# Patient Record
Sex: Female | Born: 1960
Health system: Southern US, Community
[De-identification: ages and names within clinical notes are randomized; demographics above are authoritative.]

## PROBLEM LIST (undated history)

## (undated) DIAGNOSIS — E785 Hyperlipidemia, unspecified: Secondary | ICD-10-CM

## (undated) DIAGNOSIS — L049 Acute lymphadenitis, unspecified: Secondary | ICD-10-CM

## (undated) DIAGNOSIS — R011 Cardiac murmur, unspecified: Secondary | ICD-10-CM

## (undated) DIAGNOSIS — C801 Malignant (primary) neoplasm, unspecified: Secondary | ICD-10-CM

## (undated) HISTORY — DX: Malignant (primary) neoplasm, unspecified: C80.1

## (undated) HISTORY — DX: Cardiac murmur, unspecified: R01.1

## (undated) HISTORY — DX: Acute lymphadenitis, unspecified: L04.9

## (undated) HISTORY — DX: Hyperlipidemia, unspecified: E78.5

## (undated) HISTORY — PX: ABDOMINAL HYSTERECTOMY: SHX81

---

## 2003-11-14 ENCOUNTER — Encounter: Admission: RE | Admit: 2003-11-14 | Discharge: 2003-11-14 | Payer: Self-pay | Admitting: Family Medicine

## 2004-10-17 ENCOUNTER — Other Ambulatory Visit: Admission: RE | Admit: 2004-10-17 | Discharge: 2004-10-17 | Payer: Self-pay | Admitting: Family Medicine

## 2004-10-17 ENCOUNTER — Ambulatory Visit: Payer: Self-pay | Admitting: Family Medicine

## 2004-11-14 ENCOUNTER — Encounter: Admission: RE | Admit: 2004-11-14 | Discharge: 2004-11-14 | Payer: Self-pay | Admitting: Family Medicine

## 2005-12-18 ENCOUNTER — Ambulatory Visit: Payer: Self-pay | Admitting: Family Medicine

## 2005-12-18 ENCOUNTER — Other Ambulatory Visit: Admission: RE | Admit: 2005-12-18 | Discharge: 2005-12-18 | Payer: Self-pay | Admitting: Family Medicine

## 2005-12-18 ENCOUNTER — Encounter: Payer: Self-pay | Admitting: Family Medicine

## 2006-01-05 ENCOUNTER — Encounter: Admission: RE | Admit: 2006-01-05 | Discharge: 2006-01-05 | Payer: Self-pay | Admitting: Family Medicine

## 2007-02-01 ENCOUNTER — Telehealth (INDEPENDENT_AMBULATORY_CARE_PROVIDER_SITE_OTHER): Payer: Self-pay | Admitting: *Deleted

## 2007-02-23 ENCOUNTER — Encounter: Payer: Self-pay | Admitting: Family Medicine

## 2007-02-23 ENCOUNTER — Other Ambulatory Visit: Admission: RE | Admit: 2007-02-23 | Discharge: 2007-02-23 | Payer: Self-pay | Admitting: Family Medicine

## 2007-02-23 ENCOUNTER — Ambulatory Visit: Payer: Self-pay | Admitting: Family Medicine

## 2007-02-23 DIAGNOSIS — Z8679 Personal history of other diseases of the circulatory system: Secondary | ICD-10-CM | POA: Insufficient documentation

## 2007-02-23 DIAGNOSIS — Z9189 Other specified personal risk factors, not elsewhere classified: Secondary | ICD-10-CM | POA: Insufficient documentation

## 2007-02-26 ENCOUNTER — Encounter (INDEPENDENT_AMBULATORY_CARE_PROVIDER_SITE_OTHER): Payer: Self-pay | Admitting: *Deleted

## 2007-04-26 ENCOUNTER — Encounter: Payer: Self-pay | Admitting: Family Medicine

## 2007-04-26 ENCOUNTER — Encounter: Admission: RE | Admit: 2007-04-26 | Discharge: 2007-04-26 | Payer: Self-pay | Admitting: Family Medicine

## 2007-05-03 ENCOUNTER — Encounter (INDEPENDENT_AMBULATORY_CARE_PROVIDER_SITE_OTHER): Payer: Self-pay | Admitting: *Deleted

## 2008-03-31 ENCOUNTER — Ambulatory Visit: Payer: Self-pay | Admitting: Family Medicine

## 2008-03-31 DIAGNOSIS — D239 Other benign neoplasm of skin, unspecified: Secondary | ICD-10-CM | POA: Insufficient documentation

## 2008-03-31 DIAGNOSIS — R002 Palpitations: Secondary | ICD-10-CM | POA: Insufficient documentation

## 2008-04-06 ENCOUNTER — Ambulatory Visit: Payer: Self-pay

## 2008-04-12 ENCOUNTER — Telehealth (INDEPENDENT_AMBULATORY_CARE_PROVIDER_SITE_OTHER): Payer: Self-pay | Admitting: *Deleted

## 2008-04-12 ENCOUNTER — Encounter (INDEPENDENT_AMBULATORY_CARE_PROVIDER_SITE_OTHER): Payer: Self-pay | Admitting: *Deleted

## 2008-04-17 ENCOUNTER — Encounter: Payer: Self-pay | Admitting: Family Medicine

## 2008-04-26 ENCOUNTER — Encounter: Payer: Self-pay | Admitting: Family Medicine

## 2008-05-03 ENCOUNTER — Encounter: Admission: RE | Admit: 2008-05-03 | Discharge: 2008-05-03 | Payer: Self-pay | Admitting: Family Medicine

## 2008-05-10 ENCOUNTER — Encounter: Admission: RE | Admit: 2008-05-10 | Discharge: 2008-05-10 | Payer: Self-pay | Admitting: Family Medicine

## 2008-05-12 ENCOUNTER — Encounter (INDEPENDENT_AMBULATORY_CARE_PROVIDER_SITE_OTHER): Payer: Self-pay | Admitting: *Deleted

## 2008-05-17 ENCOUNTER — Ambulatory Visit: Payer: Self-pay | Admitting: Family Medicine

## 2008-05-17 DIAGNOSIS — L049 Acute lymphadenitis, unspecified: Secondary | ICD-10-CM | POA: Insufficient documentation

## 2008-05-17 DIAGNOSIS — L708 Other acne: Secondary | ICD-10-CM | POA: Insufficient documentation

## 2008-05-18 ENCOUNTER — Encounter: Payer: Self-pay | Admitting: Family Medicine

## 2008-07-18 ENCOUNTER — Telehealth (INDEPENDENT_AMBULATORY_CARE_PROVIDER_SITE_OTHER): Payer: Self-pay | Admitting: *Deleted

## 2008-07-24 ENCOUNTER — Ambulatory Visit: Payer: Self-pay | Admitting: Family Medicine

## 2008-07-29 LAB — CONVERTED CEMR LAB
Albumin: 4.1 g/dL (ref 3.5–5.2)
HDL: 47.8 mg/dL (ref >39.00)
Total CHOL/HDL Ratio: 3
Total Protein: 7.7 g/dL (ref 6.0–8.3)
VLDL: 12.8 mg/dL (ref 0.0–40.0)

## 2008-07-31 ENCOUNTER — Encounter (INDEPENDENT_AMBULATORY_CARE_PROVIDER_SITE_OTHER): Payer: Self-pay | Admitting: *Deleted

## 2008-08-19 HISTORY — PX: MOHS SURGERY: SUR867

## 2009-02-28 ENCOUNTER — Encounter (INDEPENDENT_AMBULATORY_CARE_PROVIDER_SITE_OTHER): Payer: Self-pay | Admitting: *Deleted

## 2009-04-24 ENCOUNTER — Ambulatory Visit: Payer: Self-pay | Admitting: Family Medicine

## 2009-04-24 ENCOUNTER — Other Ambulatory Visit: Admission: RE | Admit: 2009-04-24 | Discharge: 2009-04-24 | Payer: Self-pay | Admitting: Family Medicine

## 2009-04-24 DIAGNOSIS — Z780 Asymptomatic menopausal state: Secondary | ICD-10-CM | POA: Insufficient documentation

## 2009-04-24 DIAGNOSIS — E785 Hyperlipidemia, unspecified: Secondary | ICD-10-CM | POA: Insufficient documentation

## 2009-04-24 DIAGNOSIS — C44300 Unspecified malignant neoplasm of skin of unspecified part of face: Secondary | ICD-10-CM | POA: Insufficient documentation

## 2009-04-24 DIAGNOSIS — C44310 Basal cell carcinoma of skin of unspecified parts of face: Secondary | ICD-10-CM | POA: Insufficient documentation

## 2009-04-24 LAB — CONVERTED CEMR LAB
Ketones, urine, test strip: NEGATIVE
Nitrite: NEGATIVE
Specific Gravity, Urine: 1.025
Urobilinogen, UA: NEGATIVE
WBC Urine, dipstick: NEGATIVE
pH: 5

## 2009-04-25 ENCOUNTER — Encounter (INDEPENDENT_AMBULATORY_CARE_PROVIDER_SITE_OTHER): Payer: Self-pay | Admitting: *Deleted

## 2009-04-25 LAB — CONVERTED CEMR LAB
ALT: 32 units/L (ref 0–35)
Bilirubin, Direct: 0.1 mg/dL (ref 0.0–0.3)
Cholesterol: 162 mg/dL (ref 0–200)
Creatinine, Ser: 0.9 mg/dL (ref 0.4–1.2)
Eosinophils Absolute: 0.1 10*3/uL (ref 0.0–0.7)
Eosinophils Relative: 2 % (ref 0.0–5.0)
GFR calc non Af Amer: 70.28 mL/min (ref >60)
HDL: 59.7 mg/dL (ref >39.00)
Hemoglobin: 14.2 g/dL (ref 12.0–15.0)
LDL Cholesterol: 86 mg/dL (ref 0–99)
Lymphocytes Relative: 32.8 % (ref 12.0–46.0)
Lymphs Abs: 1.7 10*3/uL (ref 0.7–4.0)
MCHC: 33.6 g/dL (ref 30.0–36.0)
MCV: 94.9 fL (ref 78.0–100.0)
Platelets: 189 10*3/uL (ref 150.0–400.0)
Potassium: 4 meq/L (ref 3.5–5.1)
RBC: 4.45 M/uL (ref 3.87–5.11)
RDW: 12.5 % (ref 11.5–14.6)
Sodium: 140 meq/L (ref 135–145)
Total Bilirubin: 1 mg/dL (ref 0.3–1.2)
Total CHOL/HDL Ratio: 3
Total Protein: 7.7 g/dL (ref 6.0–8.3)
Triglycerides: 82 mg/dL (ref 0.0–149.0)
WBC: 5.2 10*3/uL (ref 4.5–10.5)

## 2009-04-27 ENCOUNTER — Encounter (INDEPENDENT_AMBULATORY_CARE_PROVIDER_SITE_OTHER): Payer: Self-pay | Admitting: *Deleted

## 2009-05-07 ENCOUNTER — Encounter: Payer: Self-pay | Admitting: Family Medicine

## 2009-05-07 ENCOUNTER — Encounter: Admission: RE | Admit: 2009-05-07 | Discharge: 2009-05-07 | Payer: Self-pay | Admitting: Family Medicine

## 2009-12-25 ENCOUNTER — Encounter (INDEPENDENT_AMBULATORY_CARE_PROVIDER_SITE_OTHER): Payer: Self-pay | Admitting: *Deleted

## 2010-01-15 ENCOUNTER — Ambulatory Visit: Payer: Self-pay | Admitting: Family Medicine

## 2010-01-21 LAB — CONVERTED CEMR LAB
ALT: 21 units/L (ref 0–35)
Albumin: 4.4 g/dL (ref 3.5–5.2)
Alkaline Phosphatase: 50 units/L (ref 39–117)
Bilirubin, Direct: 0.2 mg/dL (ref 0.0–0.3)
Cholesterol: 157 mg/dL (ref 0–200)
Total Bilirubin: 0.6 mg/dL (ref 0.3–1.2)

## 2010-05-08 ENCOUNTER — Encounter
Admission: RE | Admit: 2010-05-08 | Discharge: 2010-05-08 | Payer: Self-pay | Source: Home / Self Care | Attending: Family Medicine | Admitting: Family Medicine

## 2010-05-12 ENCOUNTER — Encounter: Payer: Self-pay | Admitting: Family Medicine

## 2010-05-19 LAB — CONVERTED CEMR LAB
AST: 26 units/L (ref 0–37)
Albumin: 4 g/dL (ref 3.5–5.2)
Alkaline Phosphatase: 42 units/L (ref 39–117)
BUN: 12 mg/dL (ref 6–23)
Basophils Relative: 0.1 % (ref 0.0–3.0)
Basophils Relative: 0.3 % (ref 0.0–1.0)
Bilirubin, Direct: 0.1 mg/dL (ref 0.0–0.3)
CO2: 29 meq/L (ref 19–32)
Calcium: 9.4 mg/dL (ref 8.4–10.5)
Chloride: 101 meq/L (ref 96–112)
Chloride: 103 meq/L (ref 96–112)
Creatinine, Ser: 0.9 mg/dL (ref 0.4–1.2)
Creatinine, Ser: 1 mg/dL (ref 0.4–1.2)
Direct LDL: 158.7 mg/dL
Eosinophils Absolute: 0.1 10*3/uL (ref 0.0–0.6)
Eosinophils Absolute: 0.1 10*3/uL (ref 0.0–0.7)
Eosinophils Relative: 1.8 % (ref 0.0–5.0)
Eosinophils Relative: 2 % (ref 0.0–5.0)
GFR calc Af Amer: 86 mL/min
GFR calc non Af Amer: 63 mL/min
GFR calc non Af Amer: 71 mL/min
Glucose, Bld: 90 mg/dL (ref 70–99)
HCT: 43.3 % (ref 36.0–46.0)
HDL: 58.4 mg/dL (ref >39.0)
Hemoglobin: 14.3 g/dL (ref 12.0–15.0)
Lymphocytes Relative: 34.4 % (ref 12.0–46.0)
MCHC: 35.6 g/dL (ref 30.0–36.0)
MCV: 92.7 fL (ref 78.0–100.0)
Monocytes Absolute: 0.4 10*3/uL (ref 0.2–0.7)
Monocytes Absolute: 0.6 10*3/uL (ref 0.1–1.0)
Monocytes Relative: 11.2 % (ref 3.0–12.0)
Monocytes Relative: 11.8 % — ABNORMAL HIGH (ref 3.0–11.0)
Neutro Abs: 1.7 10*3/uL (ref 1.4–7.7)
Neutro Abs: 2.9 10*3/uL (ref 1.4–7.7)
Neutrophils Relative %: 52.5 % (ref 43.0–77.0)
Platelets: 180 10*3/uL (ref 150–400)
Potassium: 4.3 meq/L (ref 3.5–5.1)
RBC: 4.45 M/uL (ref 3.87–5.11)
RDW: 11.5 % (ref 11.5–14.6)
RDW: 11.9 % (ref 11.5–14.6)
Sodium: 139 meq/L (ref 135–145)
TSH: 0.78 microintl units/mL (ref 0.35–5.50)
TSH: 1.28 microintl units/mL (ref 0.35–5.50)
Total Protein: 7.2 g/dL (ref 6.0–8.3)
Triglycerides: 45 mg/dL (ref 0–149)
Triglycerides: 75 mg/dL (ref 0–149)

## 2010-05-22 NOTE — Letter (Signed)
Summary: Results Follow up Letter  [facility] [place]  [address] [place], [place] [address]   Phone: [phone]  Fax: [phone]    04/25/2009 MRN: [id number]  Tanya Nelson [address] [place], [place]  [address]  Dear Ms. Kinkade,  The following are the results of your recent test(s):  Test         Result    Pap Smear:        Normal _____  Not Normal _____ Comments: ______________________________________________________ Cholesterol: LDL(Bad cholesterol):         Your goal is less than:         HDL (Good cholesterol):       Your goal is more than: Comments:  ______________________________________________________ Mammogram:        Normal _____  Not Normal _____ Comments:  ___________________________________________________________________ Hemoccult:        Normal _____  Not normal _______ Comments:    _____________________________________________________________________ Other Tests:  See attachment for results.  We routinely do not discuss normal results over the telephone.  If you desire a copy of the results, or you have any questions about this information we can discuss them at your next office visit.   Sincerely,    Army Fossa CMA  April 25, 2009 8:16 AM

## 2010-05-22 NOTE — Letter (Signed)
Summary: Primary Care Appointment Letter  [facility] [place]  [address] [place], [place] [address]   Phone: [phone]  Fax: [phone]    12/25/2009 MRN: [id number]  METHA KOLASA [address] [place], [place]  [address]  Dear Ms. Elige Radon,   Your Primary Care Physician Loreen Freud DO has indicated that:    _______it is time to schedule an appointment.    _______you missed your appointment on______ and need to call and          reschedule.    __x_____you need to have lab work done(272.4  lipid, hep).    _______you need to schedule an appointment discuss lab or test results.    _______you need to call to reschedule your appointment that is                       scheduled on _________.     Please call our office as soon as possible. Our phone number is [phone]. Please press option 1. Our office is open 8a-12noon and 1p-5p, Monday through Friday.     Thank you,    [facility] Scheduler

## 2010-05-22 NOTE — Letter (Signed)
Summary: Primary Care Consult Scheduled Letter  [facility] [place]  [address] [place], [place] [address]   Phone: [phone]  Fax: [phone]      04/25/2009 MRN: [id number]  Tanya Nelson [address] [place], [place]  [address]    Dear Ms. Baker,    We have scheduled an appointment for you.  At the recommendation of Dr. Loreen Freud, we have scheduled you for a Mammogram & Bone Density Scan with [facility] on 05-07-2009 at 2:10pm.  Their address is [address], [place] [address]. The office phone number is [phone].  If this appointment day and time is not convenient for you, please feel free to call the office of the doctor you are being referred to at the number listed above and reschedule the appointment.    It is important for you to keep your scheduled appointments. We are here to make sure you are given good patient care.   Thank you,    Renee, Patient Care Coordinator [place] [facility]

## 2010-05-22 NOTE — Letter (Signed)
Summary: Results Follow up Letter  [facility] [place]  [address] [place], [place] [address]   Phone: [phone]  Fax: [phone]    04/27/2009 MRN: [id number]  Tanya Nelson [address] [place], [place]  [address]  Dear Ms. Luton,  The following are the results of your recent test(s):  Test         Result    Pap Smear:        Normal __X___  Not Normal _____ Comments: ______________________________________________________ Cholesterol: LDL(Bad cholesterol):         Your goal is less than:         HDL (Good cholesterol):       Your goal is more than: Comments:  ______________________________________________________ Mammogram:        Normal _____  Not Normal _____ Comments:  ___________________________________________________________________ Hemoccult:        Normal _____  Not normal _______ Comments:    _____________________________________________________________________ Other Tests:    We routinely do not discuss normal results over the telephone.  If you desire a copy of the results, or you have any questions about this information we can discuss them at your next office visit.   Sincerely,    Army Fossa CMA  April 27, 2009 11:06 AM

## 2010-05-22 NOTE — Letter (Signed)
Summary: Primary Care Consult Scheduled Letter  [facility] [place]  [address] [place], [place] [address]   Phone: [phone]  Fax: [phone]      04/25/2009 MRN: [id number]  Tanya Nelson [address] [place], [place]  [address]    Dear [name],    We have scheduled an appointment for you.  At the recommendation of Dr. Loreen Freud, we have scheduled you for a Screening Colonoscopy with [facility].  Your initial visit with a Pre-Visit Nurse is on 04-30-2009 at 1:00pm.  Your Colonoscopy is on 05-14-2009 arrive by 10:00am with Dr. Rob Bunting.  Their address is [address], [place] [address]. The office phone number is [phone].  If this appointment day and time is not convenient for you, please feel free to call the office of the doctor you are being referred to at the number listed above and reschedule the appointment.    It is important for you to keep your scheduled appointments. We are here to make sure you are given good patient care.   Thank you,    Renee, Patient Care Coordinator [place] [facility]

## 2010-05-22 NOTE — Assessment & Plan Note (Signed)
Summary: CPX//PH   Vital Signs:  Patient profile:   50 year old female Height:      68.5 inches Weight:      162 pounds BMI:     24.36 Temp:     98.6 degrees F oral Pulse rate:   70 / minute Pulse rhythm:   regular BP sitting:   124 / 80  (left arm) Cuff size:   regular  Vitals Entered By: Army Fossa CMA (April 24, 2009 9:16 AM) CC: CPX, pap. No complaints.  Comments med list reviewed.    History of Present Illness: Pt here for cpe , pap and labs.  Pt with no complaints.     Hyperlipidemia follow-up      This is a 50 year old woman who presents for Hyperlipidemia follow-up.  Pt states on occassion since taking pravachol she gets abd cramps and has to have BM.  Only occurs on occassion.  The patient denies muscle aches, GI upset, abdominal pain, flushing, itching, constipation, diarrhea, and fatigue.  The patient denies the following symptoms: chest pain/pressure, exercise intolerance, dypsnea, palpitations, syncope, and pedal edema.  Compliance with medications (by patient report) has been near 100%.  Dietary compliance has been fair.  The patient reports no exercise.  Adjunctive measures currently used by the patient include fiber, ASA, and weight reduction.    Preventive Screening-Counseling & Management  Alcohol-Tobacco     Alcohol drinks/day: <1     Alcohol type: occass.     Smoking Status: never     Passive Smoke Exposure: no  Caffeine-Diet-Exercise     Caffeine use/day: 4     Diet Comments: needs to work on diet     Does Patient Exercise: no     Exercise Counseling: to improve exercise regimen  Hep-HIV-STD-Contraception     HIV Risk: no     Dental Visit-last 6 months yes     Dental Care Counseling: not indicated; dental care within six months     SBE monthly: yes     SBE Education/Counseling: yes     Sun Exposure-Excessive: rarely  Safety-Violence-Falls     Seat Belt Use: 100      Sexual History:  currently monogamous and married.        Drug Use:   never.    Current Problems (verified): 1)  Postmenopausal Status  (ICD-V49.81) 2)  Hyperlipidemia  (ICD-272.4) 3)  Carcinoma, Basal Cell, Face  (ICD-173.3) 4)  Other Acne  (ICD-706.1) 5)  Acute Lymphadenitis  (ICD-683) 6)  Palpitations, Occasional  (ICD-785.1) 7)  Mole  (ICD-216.9) 8)  Preventive Health Care  (ICD-V70.0) 9)  Wisdom Teeth Extraction, Hx of  (ICD-V15.9) 10)  Cardiac Murmur, Hx of  (ICD-V12.50)  Current Medications (verified): 1)  Estradiol 0.05 Mg/24hr Ptwk (Estradiol) .... Apply 1 Patch Every Week 2)  Pravachol 40 Mg Tabs (Pravastatin Sodium) .... Take 1 By Mouth At Bedtime  Allergies (verified): No Known Drug Allergies  Past History:  Family History: Last updated: 02/23/2007 Family History of Prostate CA 1st degree relative at 12-- F M--brain aneurysm, htn Family History Hypertension  Social History: Last updated: 02/23/2007 Occupation:  [employer] Married Alcohol use-yes Drug use-no Regular exercise-yes  Risk Factors: Alcohol Use: <1 (04/24/2009) Caffeine Use: 4 (04/24/2009) Diet: needs to work on diet (04/24/2009) Exercise: no (04/24/2009)  Risk Factors: Smoking Status: never (04/24/2009) Passive Smoke Exposure: no (04/24/2009)  Past Medical History: heart murmur Current Problems:  PREVENTIVE HEALTH CARE (ICD-V70.0) WISDOM TEETH EXTRACTION, HX OF (ICD-V15.9) CARDIAC  MURMUR, HX OF (ICD-V12.50) Current Problems:  CARCINOMA, BASAL CELL, FACE (ICD-173.3) OTHER ACNE (ICD-706.1) ACUTE LYMPHADENITIS (ICD-683) PALPITATIONS, OCCASIONAL (ICD-785.1) MOLE (ICD-216.9) PREVENTIVE HEALTH CARE (ICD-V70.0) WISDOM TEETH EXTRACTION, HX OF (ICD-V15.9) CARDIAC MURMUR, HX OF (ICD-V12.50)  Past Surgical History: Hysterectomy (1998)-- endometriosis and fibroids moh's surgery BCC--- 08/2008  Family History: Reviewed history from 02/23/2007 and no changes required. Family History of Prostate CA 1st degree relative at 70-- F M--brain aneurysm, htn Family  History Hypertension  Social History: Reviewed history from 02/23/2007 and no changes required. Occupation:  [employer] Married Alcohol use-yes Drug use-no Regular exercise-yes Does Patient Exercise:  no Dental Care w/in 6 mos.:  yes Sexual History:  currently monogamous, married Drug Use:  never  Review of Systems      See HPI General:  Denies chills, fatigue, fever, loss of appetite, malaise, sleep disorder, sweats, weakness, and weight loss. Eyes:  Denies blurring, discharge, double vision, eye irritation, eye pain, halos, itching, light sensitivity, red eye, vision loss-1 eye, and vision loss-both eyes; optho--q2y. ENT:  Denies decreased hearing, difficulty swallowing, ear discharge, earache, hoarseness, nasal congestion, nosebleeds, postnasal drainage, ringing in ears, sinus pressure, and sore throat. CV:  Denies bluish discoloration of lips or nails, chest pain or discomfort, difficulty breathing at night, difficulty breathing while lying down, fainting, fatigue, leg cramps with exertion, lightheadness, near fainting, palpitations, shortness of breath with exertion, swelling of feet, swelling of hands, and weight gain. Resp:  Denies chest discomfort, chest pain with inspiration, cough, coughing up blood, excessive snoring, hypersomnolence, morning headaches, pleuritic, shortness of breath, sputum productive, and wheezing. GI:  Denies abdominal pain, bloody stools, change in bowel habits, constipation, dark tarry stools, diarrhea, excessive appetite, gas, hemorrhoids, indigestion, loss of appetite, and nausea. GU:  Denies abnormal vaginal bleeding, decreased libido, discharge, dysuria, hematuria, incontinence, nocturia, urinary frequency, and urinary hesitancy. MS:  Denies joint pain, joint redness, joint swelling, loss of strength, low back pain, mid back pain, muscle aches, muscle , cramps, muscle weakness, stiffness, and thoracic pain. Derm:  Denies changes in color of skin, changes in  nail beds, dryness, excessive perspiration, flushing, hair loss, insect bite(s), itching, lesion(s), poor wound healing, and rash; Derm q1y-- Dr Aileen Fass . Neuro:  Denies brief paralysis, difficulty with concentration, disturbances in coordination, falling down, headaches, inability to speak, memory loss, numbness, poor balance, seizures, sensation of room spinning, tingling, tremors, visual disturbances, and weakness. Psych:  Denies alternate hallucination ( auditory/visual), anxiety, depression, easily angered, easily tearful, irritability, mental problems, panic attacks, sense of great danger, suicidal thoughts/plans, thoughts of violence, unusual visions or sounds, and thoughts /plans of harming others. Endo:  Denies cold intolerance, excessive hunger, excessive thirst, excessive urination, heat intolerance, polyuria, and weight change. Heme:  Denies abnormal bruising, bleeding, enlarge lymph nodes, fevers, pallor, and skin discoloration. Allergy:  Denies hives or rash, itching eyes, persistent infections, seasonal allergies, and sneezing.  Physical Exam  General:  Well-developed,well-nourished,in no acute distress; alert,appropriate and cooperative throughout examination Head:  Normocephalic and atraumatic without obvious abnormalities. No apparent alopecia or balding. Eyes:  vision grossly intact, pupils equal, pupils round, pupils reactive to light, and no injection.   Ears:  External ear exam shows no significant lesions or deformities.  Otoscopic examination reveals clear canals, tympanic membranes are intact bilaterally without bulging, retraction, inflammation or discharge. Hearing is grossly normal bilaterally. Nose:  External nasal examination shows no deformity or inflammation. Nasal mucosa are pink and moist without lesions or exudates. Mouth:  Oral mucosa and oropharynx without  lesions or exudates.  Teeth in good repair. Neck:  No deformities, masses, or tenderness noted.no  carotid bruits.   Chest Wall:  No deformities, masses, or tenderness noted. Breasts:  No mass, nodules, thickening, tenderness, bulging, retraction, inflamation, nipple discharge or skin changes noted.   Lungs:  Normal respiratory effort, chest expands symmetrically. Lungs are clear to auscultation, no crackles or wheezes. Heart:  normal rate and no murmur.   Abdomen:  Bowel sounds positive,abdomen soft and non-tender without masses, organomegaly or hernias noted. Rectal:  No external abnormalities noted. Normal sphincter tone. No rectal masses or tenderness. Genitalia:  normal introitus, no external lesions, no vaginal discharge, mucosa pink and moist, no vaginal atrophy, no friaility or hemorrhage, and no adnexal masses or tenderness.  vaginal smear done Msk:  normal ROM, no joint tenderness, no joint swelling, no joint warmth, no redness over joints, no joint deformities, no joint instability, and no crepitation.   Pulses:  R posterior tibial normal, R dorsalis pedis normal, R carotid normal, L posterior tibial normal, L dorsalis pedis normal, and L carotid normal.   Extremities:  No clubbing, cyanosis, edema, or deformity noted with normal full range of motion of all joints.   Neurologic:  No cranial nerve deficits noted. Station and gait are normal. Plantar reflexes are down-going bilaterally. DTRs are symmetrical throughout. Sensory, motor and coordinative functions appear intact. Skin:  Intact without suspicious lesions or rashes Cervical Nodes:  No lymphadenopathy noted Axillary Nodes:  No palpable lymphadenopathy Psych:  Cognition and judgment appear intact. Alert and cooperative with normal attention span and concentration. No apparent delusions, illusions, hallucinations   Impression & Recommendations:  Problem # 1:  PREVENTIVE HEALTH CARE (ICD-V70.0)  Orders: Gastroenterology Referral (GI) Venipuncture ([address]) TLB-Lipid Panel (80061-LIPID) TLB-BMP (Basic Metabolic Panel-BMET)  (80048-METABOL) TLB-CBC Platelet - w/Differential (85025-CBCD) TLB-Hepatic/Liver Function Pnl (80076-HEPATIC) TLB-TSH (Thyroid Stimulating Hormone) (84443-TSH) Radiology Referral (Radiology) EKG w/ Interpretation ([address]) UA Dipstick W/ Micro (manual) ([address])  Problem # 2:  POSTMENOPAUSAL STATUS (ICD-V49.81)  Orders: Radiology Referral (Radiology) EKG w/ Interpretation ([address])  Problem # 3:  HYPERLIPIDEMIA (ICD-272.4)  Her updated medication list for this problem includes:    Pravachol 40 Mg Tabs (Pravastatin sodium) .Marland Kitchen... Take 1 by mouth at bedtime  Orders: Venipuncture ([address]) TLB-Lipid Panel (80061-LIPID) TLB-BMP (Basic Metabolic Panel-BMET) (80048-METABOL) TLB-CBC Platelet - w/Differential (85025-CBCD) TLB-Hepatic/Liver Function Pnl (80076-HEPATIC) TLB-TSH (Thyroid Stimulating Hormone) (84443-TSH) EKG w/ Interpretation ([address])  Labs Reviewed: SGOT: 22 (07/24/2008)   SGPT: 19 (07/24/2008)   HDL:47.80 (07/24/2008), 58.4 (03/31/2008)  LDL:76 (07/24/2008), DEL (91/47/8295)  Chol:137 (07/24/2008), 232 (03/31/2008)  Trig:64.0 (07/24/2008), 75 (03/31/2008)  Problem # 4:  CARCINOMA, BASAL CELL, FACE (ICD-173.3)  Orders: EKG w/ Interpretation ([address])  Complete Medication List: 1)  Estradiol 0.05 Mg/24hr Ptwk (Estradiol) .... Apply 1 patch every week 2)  Pravachol 40 Mg Tabs (Pravastatin sodium) .... Take 1 by mouth at bedtime  Other Orders: Tdap => 46yrs IM ([address]) Admin 1st Vaccine (86578) Prescriptions: PRAVACHOL 40 MG TABS (PRAVASTATIN SODIUM) take 1 by mouth at bedtime  #30.0 Each x 5   Entered and Authorized by:   Loreen Freud DO   Signed by:   Loreen Freud DO on 04/24/2009   Method used:   Electronically to        [facility] [phone]* (retail)       [address]       [place], [place]  [address]  Ph: [phone]       Fax: [phone]   RxID:   [id number] ESTRADIOL 0.05 MG/24HR PTWK (ESTRADIOL) APPLY 1 PATCH  EVERY WEEK  #4.0 Each x 11   Entered and Authorized by:   Loreen Freud DO   Signed by:   Loreen Freud DO on 04/24/2009   Method used:   Electronically to        [id number]* (retail)       [address]       [place], [place]  [address]       Ph: [phone]       Fax: [phone]   RxID:   [id number]    EKG  Procedure date:  04/24/2009  Findings:      Normal sinus rhythm with rate of:  61 bpm     Immunization History:  Influenza Immunization History:    Influenza:  Fluvax 3+ (01/18/2009)  Immunizations Administered:  Tetanus Vaccine:    Vaccine Type: Tdap    Site: right deltoid    Mfr: GlaxoSmithKline    Dose: 0.5 ml    Route: IM    Given by: Army Fossa CMA    Exp. Date: 11/04/2010    Lot #: [id number]    Laboratory Results   Urine Tests   Date/Time Reported: April 24, 2009 10:22 AM   Routine Urinalysis   Color: yellow Appearance: Clear Glucose: negative   (Normal Range: Negative) Bilirubin: negative   (Normal Range: Negative) Ketone: negative   (Normal Range: Negative) Spec. Gravity: 1.025   (Normal Range: 1.003-1.035) Blood: negative   (Normal Range: Negative) pH: 5.0   (Normal Range: 5.0-8.0) Protein: negative   (Normal Range: Negative) Urobilinogen: negative   (Normal Range: 0-1) Nitrite: negative   (Normal Range: Negative) Leukocyte Esterace: negative   (Normal Range: Negative)    Comments: Floydene Flock  April 24, 2009 10:22 AM      Immunization History:  Influenza Immunization History:    Influenza:  fluvax 3+ (01/18/2009)  Immunizations Administered:  Tetanus Vaccine:    Vaccine Type: Tdap    Site: right deltoid    Mfr: GlaxoSmithKline    Dose: 0.5 ml    Route: IM    Given by: Army Fossa CMA    Exp. Date: 11/04/2010    Lot #: [id number]

## 2010-05-24 ENCOUNTER — Other Ambulatory Visit: Payer: Self-pay | Admitting: Family Medicine

## 2010-05-24 ENCOUNTER — Encounter: Payer: Self-pay | Admitting: Family Medicine

## 2010-05-24 ENCOUNTER — Encounter (INDEPENDENT_AMBULATORY_CARE_PROVIDER_SITE_OTHER): Payer: BC Managed Care – PPO | Admitting: Family Medicine

## 2010-05-24 ENCOUNTER — Ambulatory Visit: Admit: 2010-05-24 | Payer: Self-pay | Admitting: Family Medicine

## 2010-05-24 DIAGNOSIS — E041 Nontoxic single thyroid nodule: Secondary | ICD-10-CM

## 2010-05-24 DIAGNOSIS — Z0000 Encounter for general adult medical examination without abnormal findings: Secondary | ICD-10-CM

## 2010-05-24 DIAGNOSIS — Z136 Encounter for screening for cardiovascular disorders: Secondary | ICD-10-CM

## 2010-05-24 DIAGNOSIS — Z780 Asymptomatic menopausal state: Secondary | ICD-10-CM

## 2010-05-24 DIAGNOSIS — E785 Hyperlipidemia, unspecified: Secondary | ICD-10-CM

## 2010-05-24 LAB — CBC WITH DIFFERENTIAL/PLATELET
Basophils Relative: 0.5 % (ref 0.0–3.0)
Hemoglobin: 14.6 g/dL (ref 12.0–15.0)
Lymphocytes Relative: 35 % (ref 12.0–46.0)
Neutro Abs: 2.9 10*3/uL (ref 1.4–7.7)
Neutrophils Relative %: 52.7 % (ref 43.0–77.0)
RBC: 4.51 Mil/uL (ref 3.87–5.11)
RDW: 12.6 % (ref 11.5–14.6)

## 2010-05-24 LAB — HEPATIC FUNCTION PANEL
AST: 24 U/L (ref 0–37)
Bilirubin, Direct: 0.1 mg/dL (ref 0.0–0.3)
Total Bilirubin: 0.7 mg/dL (ref 0.3–1.2)
Total Protein: 7.3 g/dL (ref 6.0–8.3)

## 2010-05-24 LAB — BASIC METABOLIC PANEL
BUN: 13 mg/dL (ref 6–23)
CO2: 29 mEq/L (ref 19–32)
Calcium: 9.2 mg/dL (ref 8.4–10.5)
Chloride: 104 mEq/L (ref 96–112)
Glucose, Bld: 85 mg/dL (ref 70–99)

## 2010-05-24 LAB — TSH: TSH: 0.74 u[IU]/mL (ref 0.35–5.50)

## 2010-05-24 LAB — T3, FREE: T3, Free: 2.6 pg/mL (ref 2.3–4.2)

## 2010-05-24 LAB — LIPID PANEL: Triglycerides: 73 mg/dL (ref 0.0–149.0)

## 2010-05-24 LAB — T4, FREE: Free T4: 0.96 ng/dL (ref 0.60–1.60)

## 2010-05-27 ENCOUNTER — Encounter (INDEPENDENT_AMBULATORY_CARE_PROVIDER_SITE_OTHER): Payer: Self-pay | Admitting: *Deleted

## 2010-05-27 LAB — CONVERTED CEMR LAB: Vit D, 25-Hydroxy: 32 ng/mL (ref 30–89)

## 2010-05-28 ENCOUNTER — Encounter: Payer: Self-pay | Admitting: Gastroenterology

## 2010-05-29 NOTE — Assessment & Plan Note (Signed)
Summary: cpx/fasting/kn   Vital Signs:  Patient profile:   50 year old female Height:      68 inches Weight:      163.4 pounds BMI:     24.93 Pulse rate:   58 / minute Pulse rhythm:   regular BP sitting:   118 / 74  (right arm) Cuff size:   regular  Vitals Entered By: Almeta Monas CMA Duncan Dull) (May 24, 2010 8:45 AM) CC: CPX-Fasting---No pap needed   History of Present Illness: Pt here for cpe and labs.  No complaints.   No pap--s/p TAH-BSO  Hyperlipidemia follow-up      This is a 50 year old woman who presents for Hyperlipidemia follow-up.  The patient denies muscle aches, GI upset, abdominal pain, flushing, itching, constipation, diarrhea, and fatigue.  The patient denies the following symptoms: chest pain/pressure, exercise intolerance, dypsnea, palpitations, syncope, and pedal edema.  Compliance with medications (by patient report) has been near 100%.  Dietary compliance has been good.  The patient reports exercising 3-4X per week.  Adjunctive measures currently used by the patient include ASA.    Preventive Screening-Counseling & Management  Alcohol-Tobacco     Alcohol drinks/day: <1     Alcohol type: occass.     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        Drug Use:  never.    Problems Prior to Update: 1)   Postmenopausal Status  (ICD-V49.81) 2)  Hyperlipidemia  (ICD-272.4) 3)  Carcinoma, Basal Cell, Face  (ICD-173.3) 4)  Other Acne  (ICD-706.1) 5)  Acute Lymphadenitis  (ICD-683) 6)  Palpitations, Occasional  (ICD-785.1) 7)  Mole  (ICD-216.9) 8)  Preventive Health Care  (ICD-V70.0) 9)  Wisdom Teeth Extraction, Hx of  (ICD-V15.9) 10)  Cardiac Murmur, Hx of  (ICD-V12.50)  Medications Prior to Update: 1)  Estradiol 0.05 Mg/24hr Ptwk (Estradiol) .... Apply 1 Patch Every Week   *office Visit Due Now* 2)  Pravachol 40 Mg Tabs (Pravastatin Sodium) .... Take 1 By Mouth At Bedtime**labs Due Now**  Current Medications (verified): 1)  Estradiol 0.05 Mg/24hr Ptwk (Estradiol) .... Apply 1 Patch Every Week 2)  Pravachol 40 Mg Tabs (Pravastatin Sodium) .... Take 1 By Mouth At Bedtime**labs Due Now** 3)  Aspirin 81 Mg Tbec (Aspirin) .... By Mouth Once Daily 4)  Mvi .Marland Kitchen.. 1 By Mouth Once Daily  Allergies (verified): No Known Drug Allergies  Past History:  Past Medical History: Last updated: 04/24/2009 heart murmur Current Problems:  PREVENTIVE HEALTH CARE (ICD-V70.0) WISDOM TEETH EXTRACTION, HX OF (ICD-V15.9) CARDIAC MURMUR, HX OF (ICD-V12.50) Current Problems:  CARCINOMA, BASAL CELL, FACE (ICD-173.3) OTHER ACNE (ICD-706.1) ACUTE LYMPHADENITIS (  ZOX-096) PALPITATIONS, OCCASIONAL (ICD-785.1) MOLE (ICD-216.9) PREVENTIVE HEALTH CARE (ICD-V70.0) WISDOM TEETH EXTRACTION, HX OF (ICD-V15.9) CARDIAC MURMUR, HX OF (ICD-V12.50)  Past Surgical History: Last updated: 04/24/2009 Hysterectomy (1998)-- endometriosis and fibroids moh's surgery BCC--- 08/2008  Family History: Last updated: 02/23/2007 Family History of Prostate CA 1st degree relative at 32-- F M--brain aneurysm, htn Family History Hypertension  Social History: Last updated: 02/23/2007 Occupation:  [employer] Married Alcohol use-yes Drug use-no Regular exercise-yes  Risk Factors: Alcohol Use: <1 (05/24/2010) Caffeine Use: 3  (05/24/2010) Diet: needs to work on diet (05/24/2010) Exercise: yes (05/24/2010)  Risk Factors: Smoking Status: never (05/24/2010) Passive Smoke Exposure: no (05/24/2010)  Family History: Reviewed history from 02/23/2007 and no changes required. Family History of Prostate CA 1st degree relative at 58-- F M--brain aneurysm, htn Family History Hypertension  Social History: Reviewed history from 02/23/2007 and no changes required. Occupation:  [employer] Married Alcohol use-yes Drug use-no Regular exercise-yes Does Patient Exercise:  yes Caffeine use/day:  3  Review of Systems      See HPI General:  Denies chills, fatigue, fever, loss of appetite, malaise, sleep disorder, sweats, weakness, and weight loss. Eyes:  Denies blurring, discharge, double vision, eye irritation, eye pain, halos, itching, light sensitivity, red eye, vision loss-1 eye, and vision loss-both eyes; optho q1y. ENT:  Denies decreased hearing, difficulty swallowing, ear discharge, earache, hoarseness, nasal congestion, nosebleeds, postnasal drainage, ringing in ears, sinus pressure, and sore throat. CV:  Denies bluish discoloration of lips or nails, chest pain or discomfort, difficulty breathing at night, difficulty breathing while lying down, fainting, fatigue, leg cramps with exertion, lightheadness, near fainting, palpitations, shortness of breath with exertion, swelling of feet, swelling of hands, and weight gain. Resp:  Denies chest discomfort, chest pain with inspiration, cough, coughing up blood, excessive snoring, hypersomnolence, morning headaches, pleuritic, shortness of breath, sputum productive, and wheezing. GI:  Denies abdominal pain, bloody stools, change in bowel habits, constipation, dark tarry stools, diarrhea, excessive appetite, gas, hemorrhoids, indigestion, loss of appetite, nausea, vomiting, vomiting blood, and yellowish skin color. GU:  Denies abnormal vaginal bleeding, decreased libido, discharge,  dysuria, genital sores, hematuria, incontinence, nocturia, urinary frequency, and urinary hesitancy. MS:  Denies joint pain, joint redness, joint swelling, loss of strength, low back pain, mid back pain, muscle aches, muscle , cramps, muscle weakness, stiffness, and thoracic pain. Derm:  Denies changes in color of skin, changes in nail beds, dryness, excessive perspiration, flushing, hair loss, insect bite(s), itching, lesion(s), poor wound healing, and rash. Neuro:  Denies brief paralysis, difficulty with concentration, disturbances in coordination, falling down, headaches, inability to speak, memory loss, numbness, poor balance, seizures, sensation of room spinning, tingling, tremors, visual disturbances, and weakness. Psych:  Denies alternate hallucination ( auditory/visual), anxiety, depression, easily angered, easily tearful, irritability, mental problems, panic attacks, sense of great danger, suicidal thoughts/plans, thoughts of violence, unusual visions or sounds, and thoughts /plans of harming others. Endo:  Denies cold intolerance, excessive hunger, excessive thirst, excessive urination, heat intolerance, polyuria, and weight change. Heme:  Denies abnormal bruising, bleeding, enlarge lymph nodes, fevers, pallor, and skin discoloration. Allergy:  Denies hives or rash, itching eyes, persistent infections, seasonal allergies, and sneezing.  Physical Exam  General:  Well-developed,well-nourished,in no acute distress; alert,appropriate and cooperative throughout examination Head:  Normocephalic and atraumatic without obvious abnormalities. No apparent alopecia or balding. Eyes:  pupils equal, pupils round, and no injection.   Ears:  External ear exam shows no significant lesions or deformities.  Otoscopic examination reveals clear canals, tympanic membranes  are intact bilaterally without bulging, retraction, inflammation or discharge. Hearing is grossly normal bilaterally. Nose:  External nasal  examination shows no deformity or inflammation. Nasal mucosa are pink and moist without lesions or exudates. Mouth:  Oral mucosa and oropharynx without lesions or exudates.  Teeth in good repair. Neck:  No deformities, masses, or tenderness noted.no carotid bruits.   Chest Wall:  No deformities, masses, or tenderness noted. Breasts:  No mass, nodules, thickening, tenderness, bulging, retraction, inflamation, nipple discharge or skin changes noted.   Lungs:  Normal respiratory effort, chest expands symmetrically. Lungs are clear to auscultation, no crackles or wheezes. Heart:  normal rate and no murmur.   Abdomen:  Bowel sounds positive,abdomen soft and non-tender without masses, organomegaly or hernias noted. Msk:  normal ROM, no joint tenderness, no joint swelling, no joint warmth, no redness over joints, no joint deformities, no joint instability, and no crepitation.   Pulses:  R posterior tibial normal, R dorsalis pedis normal, R carotid normal, L posterior tibial normal, L dorsalis pedis normal, and L carotid normal.   Extremities:  No clubbing, cyanosis, edema, or deformity noted with normal full range of motion of all joints.   Neurologic:  No cranial nerve deficits noted. Station and gait are normal. Plantar reflexes are down-going bilaterally. DTRs are symmetrical throughout. Sensory, motor and coordinative functions appear intact. Skin:  Intact without suspicious lesions or rashes Cervical Nodes:  No lymphadenopathy noted Axillary Nodes:  No palpable lymphadenopathy Psych:  Cognition and judgment appear intact. Alert and cooperative with normal attention span and concentration. No apparent delusions, illusions, hallucinations   Impression & Recommendations:  Problem # 1:  PREVENTIVE HEALTH CARE (ICD-V70.0)  Orders: Venipuncture (14782) TLB-Lipid Panel (80061-LIPID) TLB-BMP (Basic Metabolic Panel-BMET) (80048-METABOL) TLB-CBC Platelet - w/Differential (85025-CBCD) TLB-Hepatic/Liver  Function Pnl (80076-HEPATIC) TLB-TSH (Thyroid Stimulating Hormone) (84443-TSH) T-Vitamin D (25-Hydroxy) (95621-30865) Gastroenterology Referral (GI) EKG w/ Interpretation (93000)  Problem # 2:  Hx of THYROID NODULE (ICD-241.0)  Orders: TLB-T3, Free (Triiodothyronine) (84481-T3FREE) TLB-T4 (Thyrox), Free (78469-GE9B) EKG w/ Interpretation (93000)  Problem # 3:  POSTMENOPAUSAL STATUS (ICD-V49.81)  Orders: Venipuncture (28413) TLB-Lipid Panel (80061-LIPID) TLB-BMP (Basic Metabolic Panel-BMET) (80048-METABOL) TLB-CBC Platelet - w/Differential (85025-CBCD) TLB-Hepatic/Liver Function Pnl (80076-HEPATIC) TLB-TSH (Thyroid Stimulating Hormone) (84443-TSH) T-Vitamin D (25-Hydroxy) (24401-02725) EKG w/ Interpretation (93000)  Problem # 4:  HYPERLIPIDEMIA (ICD-272.4)  Her updated medication list for this problem includes:    Pravachol 40 Mg Tabs (Pravastatin sodium) .Marland Kitchen... Take 1 by mouth at bedtime**labs due now**  Orders: Venipuncture (36644) TLB-Lipid Panel (80061-LIPID) TLB-BMP (Basic Metabolic Panel-BMET) (80048-METABOL) TLB-CBC Platelet - w/Differential (85025-CBCD) TLB-Hepatic/Liver Function Pnl (80076-HEPATIC) TLB-TSH (Thyroid Stimulating Hormone) (84443-TSH) T-Vitamin D (25-Hydroxy) (03474-25956) EKG w/ Interpretation (93000)  Labs Reviewed: SGOT: 26 (01/15/2010)   SGPT: 21 (01/15/2010)   HDL:51.80 (01/15/2010), 59.70 (04/24/2009)  LDL:93 (01/15/2010), 86 (04/24/2009)  Chol:157 (01/15/2010), 162 (04/24/2009)  Trig:60.0 (01/15/2010), 82.0 (04/24/2009)  Complete Medication List: 1)  Estradiol 0.05 Mg/24hr Ptwk (Estradiol) .... Apply 1 patch every week 2)  Pravachol 40 Mg Tabs (Pravastatin sodium) .... Take 1 by mouth at bedtime**labs due now** 3)  Aspirin 81 Mg Tbec (Aspirin) .... By mouth once daily 4)  Mvi  .Marland Kitchen.. 1 by mouth once daily  Patient Instructions: 1)  Please schedule a follow-up appointment in 6 months .  Prescriptions: ESTRADIOL 0.05 MG/24HR PTWK (ESTRADIOL)  APPLY 1 PATCH EVERY WEEK  #4 x 11   Entered and Authorized by:   Loreen Freud DO   Signed by:   Loreen Freud DO on 05/24/2010  Method used:   Electronically to        [facility] [phone]* (retail)       [address]       [place], [place]  [address]       Ph: [phone]       Fax: [phone]   RxID:   [id number]    Orders Added: 1)  Venipuncture [25956] 2)  TLB-Lipid Panel [80061-LIPID] 3)  TLB-BMP (Basic Metabolic Panel-BMET) [80048-METABOL] 4)  TLB-CBC Platelet - w/Differential [85025-CBCD] 5)  TLB-Hepatic/Liver Function Pnl [80076-HEPATIC] 6)  TLB-TSH (Thyroid Stimulating Hormone) [84443-TSH] 7)  T-Vitamin D (25-Hydroxy) [38756-43329] 8)  TLB-T3, Free (Triiodothyronine) [51884-Z6SAYT] 9)  TLB-T4 (Thyrox), Free [01601-UX3A] 10)  Gastroenterology Referral [GI] 11)  Est. Patient 40-64 years [99396] 12)  EKG w/ Interpretation [93000]   Immunization History:  Influenza Immunization History:    Influenza:  fluvax 3+ (11/28/2009)    Influenza:  given (11/28/2009)  Typhoid History:    Typhoid:  typhoid-oral (11/28/2009)  Hepatitis A Immunization History:    Hepatitis A # 1:  hepa (11/28/2009)   Immunization History:  Influenza Immunization History:    Influenza:  Fluvax 3+ (11/28/2009)  Typhoid Immunization History:    Typhoid:  Typhoid-oral (11/28/2009)  Hepatitis A Immunization History:    Hepatitis A # 1:  HepA (11/28/2009)  Last Flu Vaccine:  Fluvax 3+ (01/18/2009 9:53:28 AM) Flu Vaccine Result Date:  11/28/2009 Flu Vaccine Result:  given Flu Vaccine Next Due:  1 yr Last TD:  Tdap (04/24/2009 9:06:08 AM) TD Result Date:  04/24/2009 TD Result:  given TD Next Due:  10 yr Last PAP:  NEGATIVE FOR INTRAEPITHELIAL LESIONS OR MALIGNANCY. (04/24/2009 12:00:00 AM) PAP Next Due:  Not Indicated Last Mammogram:  ASSESSMENT: Negative - BI-RADS 1^MM DIGITAL SCREENING (05/08/2010 4:04:00 PM) Mammogram Result Date:   05/08/2010 Mammogram Result:  normal Mammogram Next Due:  1 yr

## 2010-05-30 ENCOUNTER — Encounter: Payer: Self-pay | Admitting: Family Medicine

## 2010-06-06 NOTE — Letter (Signed)
Summary: [facility] Instructions  [facility]  [address] [place], [place] [address]   Phone: [phone]  Fax: [phone]       KINBERLY PERRIS    01-28-1961    MRN: [id number]        Procedure Day Dorna Bloom:  Farrell Ours  06/07/10     Arrival Time:  7:30AM     Procedure Time:  8:30AM     Location of Procedure:                    Juliann Pares _  [facility] (4th Floor)                      PREPARATION FOR COLONOSCOPY WITH MOVIPREP   Starting 5 days prior to your procedure 06/02/10 do not eat nuts, seeds, popcorn, corn, beans, peas,  salads, or any raw vegetables.  Do not take any fiber supplements (e.g. Metamucil, Citrucel, and Benefiber).  THE DAY BEFORE YOUR PROCEDURE         DATE: 06/06/10  DAY: THURSDAY  1.  Drink clear liquids the entire day-NO SOLID FOOD  2.  Do not drink anything colored red or purple.  Avoid juices with pulp.  No orange juice.  3.  Drink at least 64 oz. (8 glasses) of fluid/clear liquids during the day to prevent dehydration and help the prep work efficiently.  CLEAR LIQUIDS INCLUDE: Water Jello Ice Popsicles Tea (sugar ok, no milk/cream) Powdered fruit flavored drinks Coffee (sugar ok, no milk/cream) Gatorade Juice: apple, white grape, white cranberry  Lemonade Clear bullion, consomm, broth Carbonated beverages (any kind) Strained chicken noodle soup Hard Candy                             4.  In the morning, mix first dose of MoviPrep solution:    Empty 1 Pouch A and 1 Pouch B into the disposable container    Add lukewarm drinking water to the top line of the container. Mix to dissolve    Refrigerate (mixed solution should be used within 24 hrs)  5.  Begin drinking the prep at 5:00 p.m. The MoviPrep container is divided by 4 marks.   Every 15 minutes drink the solution down to the next mark (approximately 8 oz) until the full liter is complete.   6.  Follow completed prep with 16 oz of clear liquid of your choice (Nothing  red or purple).  Continue to drink clear liquids until bedtime.  7.  Before going to bed, mix second dose of MoviPrep solution:    Empty 1 Pouch A and 1 Pouch B into the disposable container    Add lukewarm drinking water to the top line of the container. Mix to dissolve    Refrigerate  THE DAY OF YOUR PROCEDURE      DATE: 06/07/10   DAY: FRIDAY  Beginning at 3:30AM (5 hours before procedure):         1. Every 15 minutes, drink the solution down to the next mark (approx 8 oz) until the full liter is complete.  2. Follow completed prep with 16 oz. of clear liquid of your choice.    3. You may drink clear liquids until 6:30AM (2 HOURS BEFORE PROCEDURE).   MEDICATION INSTRUCTIONS  Unless otherwise instructed, you should take regular prescription medications with a small sip of water   as early as possible the morning of  your procedure.        OTHER INSTRUCTIONS  You will need a responsible adult at least 52 years of age to accompany you and drive you home.   This person must remain in the waiting room during your procedure.  Wear loose fitting clothing that is easily removed.  Leave jewelry and other valuables at home.  However, you may wish to bring a book to read or  an iPod/MP3 player to listen to music as you wait for your procedure to start.  Remove all body piercing jewelry and leave at home.  Total time from sign-in until discharge is approximately 2-3 hours.  You should go home directly after your procedure and rest.  You can resume normal activities the  day after your procedure.  The day of your procedure you should not:   Drive   Make legal decisions   Operate machinery   Drink alcohol   Return to work  You will receive specific instructions about eating, activities and medications before you leave.    The above instructions have been reviewed and explained to me by   Wyona Almas RN  May 28, 2010 11:08 AM     I fully understand and  can verbalize these instructions _____________________________ Date _________

## 2010-06-06 NOTE — Miscellaneous (Signed)
Summary: [facility] Previsit/prep  Clinical Lists Changes  Medications: Added new medication of MOVIPREP 100 GM  SOLR (PEG-KCL-NACL-NASULF-NA ASC-C) As per prep instructions. - Signed Rx of MOVIPREP 100 GM  SOLR (PEG-KCL-NACL-NASULF-NA ASC-C) As per prep instructions.;  #1 x 0;  Signed;  Entered by: Wyona Almas RN;  Authorized by: Rachael Fee MD;  Method used: Electronically to [facility] #[id number]*, [address], [place], [place], [place]  [address], Ph: [phone], Fax: [phone] Observations: Added new observation of NKA: T (05/28/2010 10:25)    Prescriptions: MOVIPREP 100 GM  SOLR (PEG-KCL-NACL-NASULF-NA ASC-C) As per prep instructions.  #1 x 0   Entered by:   Wyona Almas RN   Authorized by:   Rachael Fee MD   Signed by:   Wyona Almas RN on 05/28/2010   Method used:   Electronically to        [facility] [address] #[id number]* (retail)       [address]       [place], [place]  [address]       Ph: [phone]       Fax: [phone]   RxID:   [id number]

## 2010-06-06 NOTE — Letter (Signed)
Summary: Annual Breast Health Update  Annual Breast Health Update   Imported By: Kassie Mends 05/30/2010 11:10:30  _____________________________________________________________________  External Attachment:    Type:   Image     Comment:   External Document

## 2010-06-07 ENCOUNTER — Other Ambulatory Visit: Payer: Self-pay | Admitting: Gastroenterology

## 2010-06-07 ENCOUNTER — Other Ambulatory Visit (AMBULATORY_SURGERY_CENTER): Payer: BC Managed Care – PPO | Admitting: Gastroenterology

## 2010-06-07 DIAGNOSIS — Z1211 Encounter for screening for malignant neoplasm of colon: Secondary | ICD-10-CM

## 2010-06-07 DIAGNOSIS — D126 Benign neoplasm of colon, unspecified: Secondary | ICD-10-CM

## 2010-06-07 DIAGNOSIS — K5730 Diverticulosis of large intestine without perforation or abscess without bleeding: Secondary | ICD-10-CM

## 2010-06-07 DIAGNOSIS — K644 Residual hemorrhoidal skin tags: Secondary | ICD-10-CM

## 2010-06-12 NOTE — Procedures (Addendum)
Summary: Colonoscopy  Patient: Tanya Nelson Note: All result statuses are Final unless otherwise noted.  Tests: (1) Colonoscopy (COL)   COL Colonoscopy           DONE     [facility]     [address]     [place], [place]  [address]           COLONOSCOPY PROCEDURE REPORT           PATIENT:  Tanya Nelson, Tanya Nelson  MR#:  [id number]     BIRTHDATE:  [birth year], 49 yrs. old  GENDER:  female     ENDOSCOPIST:  Rachael Fee, MD     REF. BY:  Loreen Freud, DO     PROCEDURE DATE:  06/07/2010     PROCEDURE:  Colonoscopy with snare polypectomy     ASA CLASS:  Class II     INDICATIONS:  Routine Risk Screening     MEDICATIONS:   Fentanyl 50 mcg IV, Versed 6 mg IV           DESCRIPTION OF PROCEDURE:   After the risks benefits and     alternatives of the procedure were thoroughly explained, informed     consent was obtained.  Digital rectal exam was performed and     revealed no rectal masses.   The LB [id number] endoscope     was introduced through the anus and advanced to the cecum, which     was identified by both the appendix and ileocecal valve, without     limitations.  The quality of the prep was good, using MoviPrep.     The instrument was then slowly withdrawn as the colon was fully     examined.     <<PROCEDUREIMAGES>>           FINDINGS:  A diminutive polyp was found in the sigmoid colon. This     was removed with cold snare and sent to pathology (jar 1) (see     image4).  Mild diverticulosis was found in the sigmoid to     descending colon segments (see image5).  Internal and external     Hemorrhoids were found.  This was otherwise a normal examination     of the colon (see image2, image1, and image6).   Retroflexed views     in the rectum revealed no abnormalities.    The scope was then     withdrawn from the patient and the procedure completed.           COMPLICATIONS:  None     ENDOSCOPIC IMPRESSION:     1) Diminutive polyp in the sigmoid colon, removed and  sent to     pathology     2) Mild diverticulosis in the sigmoid to descending colon     segments     3) Internal and external hemorrhoids     4) Otherwise normal examination           RECOMMENDATIONS:     1) If the polyp(s) removed today are proven to be adenomatous     (pre-cancerous) polyps, you will need a repeat colonoscopy in 5     years. Otherwise you should continue to follow colorectal cancer     screening guidelines for "routine risk" patients with colonoscopy     in 10 years.     2) You will receive a letter within 1-2 weeks with the results     of your biopsy as well  as final recommendations. Please call my     office if you have not received a letter after 3 weeks.           ______________________________     Rachael Fee, MD           n.     eSIGNED:   Rachael Fee at 06/07/2010 08:45 AM           Lajoyce Lauber, [id number]  Note: An exclamation mark (!) indicates a result that was not dispersed into the flowsheet. Document Creation Date: 06/07/2010 8:45 AM _______________________________________________________________________  (1) Order result status: Final Collection or observation date-time: 06/07/2010 08:41 Requested date-time:  Receipt date-time:  Reported date-time:  Referring Physician:   Ordering Physician: Rob Bunting [phone]) Specimen Source:  Source: Launa Grill Order Number: [id number] Lab site:   Appended Document: Colonoscopy recall     Procedures Next Due Date:    Colonoscopy: 05/2020

## 2010-06-18 ENCOUNTER — Encounter: Payer: Self-pay | Admitting: Gastroenterology

## 2010-06-27 NOTE — Letter (Signed)
Summary: Results Letter  [facility]  [address] [place], [place] [address]   Phone: [phone]  Fax: [phone]        June 18, 2010 MRN: [id number]    Tanya Nelson [address] [place], [place]  [address]    Dear Ms. Monica,   Good news.  The polyp that was removed during your recent procedure was NOT pre-cancerous.  You should continue to follow current colorectal cancer screening guidelines with a repeat colonoscopy in 10 years.  We will therefore put your information in our reminder system and will contact you in 10 years to schedule a repeat procedure.  Please call if you have any questions or concerns.       Sincerely,  Rachael Fee MD  This letter has been electronically signed by your physician.  Appended Document: Results Letter letter mailed

## 2010-07-01 ENCOUNTER — Encounter: Payer: Self-pay | Admitting: Family Medicine

## 2010-08-07 ENCOUNTER — Other Ambulatory Visit: Payer: Self-pay | Admitting: Family Medicine

## 2010-09-26 ENCOUNTER — Encounter: Payer: Self-pay | Admitting: Family Medicine

## 2010-09-26 ENCOUNTER — Ambulatory Visit (INDEPENDENT_AMBULATORY_CARE_PROVIDER_SITE_OTHER): Payer: BC Managed Care – PPO | Admitting: Family Medicine

## 2010-09-26 VITALS — BP 108/72 | HR 69 | Temp 99.2°F | Wt 162.6 lb

## 2010-09-26 DIAGNOSIS — R350 Frequency of micturition: Secondary | ICD-10-CM

## 2010-09-26 DIAGNOSIS — N390 Urinary tract infection, site not specified: Secondary | ICD-10-CM

## 2010-09-26 LAB — POCT URINALYSIS DIPSTICK
Bilirubin, UA: NEGATIVE
Ketones, UA: NEGATIVE
pH, UA: 5

## 2010-09-26 MED ORDER — CIPROFLOXACIN HCL 500 MG PO TABS: 500.0000 mg | ORAL_TABLET | Freq: Two times a day (BID) | ORAL | Status: AC

## 2010-09-26 NOTE — Patient Instructions (Signed)
Urinary Tract Infection (UTI)   Infections of the urinary tract can start in several places. A bladder infection (cystitis), a kidney infection (pyelonephritis), and a prostate infection (prostatitis) are different types of urinary tract infections. They usually get better if treated with medicines (antibiotics) that kill germs. Take all the medicine until it is gone. You or your child may feel better in a few days, but TAKE ALL MEDICINE or the infection may not respond and may become more difficult to treat.   HOME CARE INSTRUCTIONS   Drink enough water and fluids to keep the urine clear or pale yellow. Cranberry juice is especially recommended, in addition to large amounts of water.   Avoid caffeine, tea, and carbonated beverages. They tend to irritate the bladder.   Alcohol may irritate the prostate.   Only take over-the-counter or prescription medicines for pain, discomfort, or fever as directed by your caregiver.   FINDING OUT THE RESULTS OF YOUR TEST   Not all test results are available during your visit. If your or your child's test results are not back during the visit, make an appointment with your caregiver to find out the results. Do not assume everything is normal if you have not heard from your caregiver or the medical facility. It is important for you to follow up on all test results.   TO PREVENT FURTHER INFECTIONS:   Empty the bladder often. Avoid holding urine for long periods of time.   After a bowel movement, women should cleanse from front to back. Use each tissue only once.   Empty the bladder before and after sexual intercourse.   SEEK MEDICAL CARE IF:   There is back pain.   You or your child has an oral temperature above 100.4.   Your baby is older than 3 months with a rectal temperature of 100.5º F (38.1° C) or higher for more than 1 day.   Your or your child's problems (symptoms) are no better in 3 days. Return sooner if you or your child is getting worse.   SEEK IMMEDIATE MEDICAL CARE IF:    There is severe back pain or lower abdominal pain.   You or your child develops chills.   You or your child has an oral temperature above 100.4, not controlled by medicine.   Your baby is older than 3 months with a rectal temperature of 102º F (38.9º C) or higher.   Your baby is 3 months old or younger with a rectal temperature of 100.4º F (38º C) or higher.   There is nausea or vomiting.   There is continued burning or discomfort with urination.   MAKE SURE YOU:   Understand these instructions.   Will watch this condition.   Will get help right away if you or your child is not doing well or gets worse.   Document Released: 01/15/2005 Document Re-Released: 07/02/2009   [employer]® Patient Information ©2011 [employer].

## 2010-09-26 NOTE — Progress Notes (Signed)
  Subjective:    Tanya Nelson is a 50 y.o. female who complains of dysuria, frequency, suprapubic pressure and chills. She has had symptoms for 2 days. Patient also complains of fever and stomach ache. Patient denies congestion, cough, headache, rhinitis, sorethroat and vaginal discharge. Patient does not have a history of recurrent UTI. Patient does not have a history of pyelonephritis.   The following portions of the patient's history were reviewed and updated as appropriate: allergies, current medications, past family history, past medical history, past social history, past surgical history and problem list.  Review of Systems Pertinent items are noted in HPI.    Objective:    BP 108/72  Pulse 69  Temp(Src) 99.2 F (37.3 C) (Oral)  Wt 162 lb 9.6 oz (73.755 kg)  SpO2 98% General appearance: alert, cooperative and no distress Abdomen: soft, non-tender; bowel sounds normal; no masses,  no organomegaly Lymph nodes: Cervical, supraclavicular, and axillary nodes normal.  Laboratory:  Urine dipstick: trace for hemoglobin and trace for leukocyte esterase.   Micro exam: not done.    Assessment:    Acute cystitis and UTI     Plan:    Medications: ciprofloxacin. Maintain adequate hydration. Follow up if symptoms not improving, and as needed.

## 2010-09-28 LAB — URINE CULTURE: Colony Count: 70000

## 2011-01-29 ENCOUNTER — Other Ambulatory Visit: Payer: Self-pay | Admitting: Family Medicine

## 2011-03-06 ENCOUNTER — Other Ambulatory Visit: Payer: Self-pay | Admitting: Family Medicine

## 2011-03-07 ENCOUNTER — Other Ambulatory Visit: Payer: Self-pay | Admitting: Family Medicine

## 2011-03-07 NOTE — Telephone Encounter (Signed)
Letter mailed     KP 

## 2011-03-10 ENCOUNTER — Other Ambulatory Visit (INDEPENDENT_AMBULATORY_CARE_PROVIDER_SITE_OTHER): Payer: BC Managed Care – PPO

## 2011-03-10 DIAGNOSIS — E785 Hyperlipidemia, unspecified: Secondary | ICD-10-CM

## 2011-03-10 LAB — BASIC METABOLIC PANEL
BUN: 15 mg/dL (ref 6–23)
CO2: 26 mEq/L (ref 19–32)
Calcium: 8.8 mg/dL (ref 8.4–10.5)
Chloride: 106 mEq/L (ref 96–112)
GFR: 64.76 mL/min (ref >60.00)
GFR: 63.99 mL/min (ref >60.00)
Glucose, Bld: 81 mg/dL (ref 70–99)
Potassium: 4 mEq/L (ref 3.5–5.1)
Sodium: 139 mEq/L (ref 135–145)

## 2011-03-10 LAB — LIPID PANEL
Cholesterol: 162 mg/dL (ref 0–200)
LDL Cholesterol: 89 mg/dL (ref 0–99)
Triglycerides: 115 mg/dL (ref 0.0–149.0)
VLDL: 23 mg/dL (ref 0.0–40.0)

## 2011-03-10 NOTE — Progress Notes (Signed)
12  

## 2011-04-03 ENCOUNTER — Other Ambulatory Visit: Payer: Self-pay | Admitting: Family Medicine

## 2011-04-25 ENCOUNTER — Other Ambulatory Visit: Payer: Self-pay | Admitting: Family Medicine

## 2011-04-30 ENCOUNTER — Other Ambulatory Visit: Payer: Self-pay | Admitting: Family Medicine

## 2011-04-30 DIAGNOSIS — Z1231 Encounter for screening mammogram for malignant neoplasm of breast: Secondary | ICD-10-CM

## 2011-05-21 ENCOUNTER — Ambulatory Visit
Admission: RE | Admit: 2011-05-21 | Discharge: 2011-05-21 | Disposition: A | Discharge status: Home / Self Care | Payer: BC Managed Care – PPO | Source: Ambulatory Visit | Attending: Family Medicine | Admitting: Family Medicine

## 2011-05-21 DIAGNOSIS — Z1231 Encounter for screening mammogram for malignant neoplasm of breast: Secondary | ICD-10-CM

## 2011-09-28 ENCOUNTER — Other Ambulatory Visit: Payer: Self-pay | Admitting: Family Medicine

## 2011-10-03 ENCOUNTER — Encounter: Payer: Self-pay | Admitting: Family Medicine

## 2011-10-03 ENCOUNTER — Ambulatory Visit (INDEPENDENT_AMBULATORY_CARE_PROVIDER_SITE_OTHER): Payer: BC Managed Care – PPO | Admitting: Family Medicine

## 2011-10-03 VITALS — BP 120/70 | HR 60 | Temp 97.7°F | Wt 165.0 lb

## 2011-10-03 DIAGNOSIS — J309 Allergic rhinitis, unspecified: Secondary | ICD-10-CM

## 2011-10-03 DIAGNOSIS — J302 Other seasonal allergic rhinitis: Secondary | ICD-10-CM

## 2011-10-03 MED ORDER — AZELASTINE-FLUTICASONE 137-50 MCG/ACT NA SUSP: 1.0000 | Freq: Two times a day (BID) | NASAL | Status: DC

## 2011-10-03 MED ORDER — CEFUROXIME AXETIL 500 MG PO TABS: 500.0000 mg | ORAL_TABLET | Freq: Two times a day (BID) | ORAL | Status: AC

## 2011-10-03 NOTE — Progress Notes (Signed)
  Subjective:     Tanya Nelson is a 51 y.o. female who presents for evaluation of symptoms of a URI. Symptoms include bilateral ear pressure/pain, congestion, no  fever and occasssional headache. Onset of symptoms was a few weeks ago, and has been gradually improving since that time. Treatment to date: none.  The following portions of the patient's history were reviewed and updated as appropriate: allergies, current medications, past family history, past medical history, past social history, past surgical history and problem list.  Review of Systems Pertinent items are noted in HPI.   Objective:    BP 120/70  Pulse 60  Temp 97.7 F (36.5 C) (Oral)  Wt 165 lb (74.844 kg)  SpO2 97% General appearance: alert, cooperative, appears stated age and no distress Ears: bulging TM b/L  + fluid and pink  Nose: clear discharge, mild congestion, turbinates red, swollen Throat: lips, mucosa, and tongue normal; teeth and gums normal Lungs: clear to auscultation bilaterally Heart: S1, S2 normal   Assessment:    viral upper respiratory illness   Plan:    Discussed diagnosis and treatment of URI. Suggested symptomatic OTC remedies. Nasal saline spray for congestion. Nasal steroids per orders. Follow up as needed. if symptoms worsen over weekend ok to fill abx  If no pressure relief--refer to ent

## 2011-10-03 NOTE — Patient Instructions (Signed)
Allergic Rhinitis  Allergic rhinitis is when the mucous membranes in the nose respond to allergens. Allergens are particles in the air that cause your body to have an allergic reaction. This causes you to release allergic antibodies. Through a chain of events, these eventually cause you to release histamine into the blood stream (hence the use of antihistamines). Although meant to be protective to the body, it is this release that causes your discomfort, such as frequent sneezing, congestion and an itchy runny nose.    CAUSES    The pollen allergens may come from grasses, trees, and weeds. This is seasonal allergic rhinitis, or "hay fever." Other allergens cause year-round allergic rhinitis (perennial allergic rhinitis) such as house dust mite allergen, pet dander and mold spores.    SYMPTOMS     Nasal stuffiness (congestion).   Runny, itchy nose with sneezing and tearing of the eyes.   There is often an itching of the mouth, eyes and ears.  It cannot be cured, but it can be controlled with medications.  DIAGNOSIS    If you are unable to determine the offending allergen, skin or blood testing may find it.  TREATMENT     Avoid the allergen.   Medications and allergy shots (immunotherapy) can help.   Hay fever may often be treated with antihistamines in pill or nasal spray forms. Antihistamines block the effects of histamine. There are over-the-counter medicines that may help with nasal congestion and swelling around the eyes. Check with your caregiver before taking or giving this medicine.  If the treatment above does not work, there are many new medications your caregiver can prescribe. Stronger medications may be used if initial measures are ineffective. Desensitizing injections can be used if medications and avoidance fails. Desensitization is when a patient is given ongoing shots until the body becomes less sensitive to the allergen. Make sure you follow up with your caregiver if problems continue.  SEEK  MEDICAL CARE IF:     You develop fever (more than 100.5 F (38.1 C).   You develop a cough that does not stop easily (persistent).   You have shortness of breath.   You start wheezing.   Symptoms interfere with normal daily activities.  Document Released: 12/31/2000 Document Revised: 03/27/2011 Document Reviewed: 07/12/2008  [employer] Patient Information 2012 [employer].

## 2011-11-05 ENCOUNTER — Other Ambulatory Visit: Payer: Self-pay

## 2011-11-05 MED ORDER — PRAVASTATIN SODIUM 40 MG PO TABS: 40.0000 mg | ORAL_TABLET | Freq: Every day | ORAL | Status: DC

## 2011-11-06 ENCOUNTER — Ambulatory Visit (INDEPENDENT_AMBULATORY_CARE_PROVIDER_SITE_OTHER): Payer: BC Managed Care – PPO | Admitting: Family Medicine

## 2011-11-06 ENCOUNTER — Other Ambulatory Visit (HOSPITAL_COMMUNITY)
Admission: RE | Admit: 2011-11-06 | Discharge: 2011-11-06 | Disposition: A | Discharge status: Home / Self Care | Payer: BC Managed Care – PPO | Source: Ambulatory Visit | Attending: Family Medicine | Admitting: Family Medicine

## 2011-11-06 ENCOUNTER — Encounter: Payer: Self-pay | Admitting: Family Medicine

## 2011-11-06 VITALS — BP 112/68 | HR 60 | Temp 98.7°F | Ht 67.75 in | Wt 165.0 lb

## 2011-11-06 DIAGNOSIS — Z0000 Encounter for general adult medical examination without abnormal findings: Secondary | ICD-10-CM

## 2011-11-06 DIAGNOSIS — J309 Allergic rhinitis, unspecified: Secondary | ICD-10-CM

## 2011-11-06 DIAGNOSIS — Z124 Encounter for screening for malignant neoplasm of cervix: Secondary | ICD-10-CM

## 2011-11-06 DIAGNOSIS — J302 Other seasonal allergic rhinitis: Secondary | ICD-10-CM

## 2011-11-06 DIAGNOSIS — E785 Hyperlipidemia, unspecified: Secondary | ICD-10-CM

## 2011-11-06 DIAGNOSIS — Z01419 Encounter for gynecological examination (general) (routine) without abnormal findings: Secondary | ICD-10-CM | POA: Insufficient documentation

## 2011-11-06 LAB — CBC WITH DIFFERENTIAL/PLATELET
Basophils Absolute: 0 10*3/uL (ref 0.0–0.1)
Eosinophils Absolute: 0.1 10*3/uL (ref 0.0–0.7)
Lymphocytes Relative: 36 % (ref 12.0–46.0)
MCHC: 33.7 g/dL (ref 30.0–36.0)
MCV: 92.6 fl (ref 78.0–100.0)
Monocytes Absolute: 0.6 10*3/uL (ref 0.1–1.0)
Neutrophils Relative %: 51 % (ref 43.0–77.0)
Platelets: 210 10*3/uL (ref 150.0–400.0)
WBC: 5.4 10*3/uL (ref 4.5–10.5)

## 2011-11-06 LAB — POCT URINALYSIS DIPSTICK
Bilirubin, UA: NEGATIVE
Glucose, UA: NEGATIVE
Ketones, UA: NEGATIVE
Leukocytes, UA: NEGATIVE
Nitrite, UA: NEGATIVE

## 2011-11-06 LAB — BASIC METABOLIC PANEL
BUN: 13 mg/dL (ref 6–23)
CO2: 29 mEq/L (ref 19–32)
Calcium: 9.2 mg/dL (ref 8.4–10.5)
Chloride: 102 mEq/L (ref 96–112)
Creatinine, Ser: 1 mg/dL (ref 0.4–1.2)

## 2011-11-06 LAB — LIPID PANEL
HDL: 56.4 mg/dL (ref >39.00)
LDL Cholesterol: 94 mg/dL (ref 0–99)
Total CHOL/HDL Ratio: 3
Triglycerides: 113 mg/dL (ref 0.0–149.0)
VLDL: 22.6 mg/dL (ref 0.0–40.0)

## 2011-11-06 LAB — HEPATIC FUNCTION PANEL
Alkaline Phosphatase: 49 U/L (ref 39–117)
Bilirubin, Direct: 0.1 mg/dL (ref 0.0–0.3)
Total Bilirubin: 0.6 mg/dL (ref 0.3–1.2)

## 2011-11-06 LAB — TSH: TSH: 0.86 u[IU]/mL (ref 0.35–5.50)

## 2011-11-06 MED ORDER — PRAVASTATIN SODIUM 40 MG PO TABS: 40.0000 mg | ORAL_TABLET | Freq: Every day | ORAL | Status: DC

## 2011-11-06 MED ORDER — BECLOMETHASONE DIPROPIONATE 80 MCG/ACT NA AERS: 2.0000 | INHALATION_SPRAY | Freq: Every day | NASAL | Status: DC

## 2011-11-06 NOTE — Patient Instructions (Signed)
Preventive Care for Adults, Female A healthy lifestyle and preventive care can promote health and wellness. Preventive health guidelines for women include the following key practices.  A routine yearly physical is a good way to check with your caregiver about your health and preventive screening. It is a chance to share any concerns and updates on your health, and to receive a thorough exam.   Visit your dentist for a routine exam and preventive care every 6 months. Brush your teeth twice a day and floss once a day. Good oral hygiene prevents tooth decay and gum disease.   The frequency of eye exams is based on your age, health, family medical history, use of contact lenses, and other factors. Follow your caregiver's recommendations for frequency of eye exams.   Eat a healthy diet. Foods like vegetables, fruits, whole grains, low-fat dairy products, and lean protein foods contain the nutrients you need without too many calories. Decrease your intake of foods high in solid fats, added sugars, and salt. Eat the right amount of calories for you.Get information about a proper diet from your caregiver, if necessary.   Regular physical exercise is one of the most important things you can do for your health. Most adults should get at least 150 minutes of moderate-intensity exercise (any activity that increases your heart rate and causes you to sweat) each week. In addition, most adults need muscle-strengthening exercises on 2 or more days a week.   Maintain a healthy weight. The body mass index (BMI) is a screening tool to identify possible weight problems. It provides an estimate of body fat based on height and weight. Your caregiver can help determine your BMI, and can help you achieve or maintain a healthy weight.For adults 20 years and older:   A BMI below 18.5 is considered underweight.   A BMI of 18.5 to 24.9 is normal.   A BMI of 25 to 29.9 is considered overweight.   A BMI of 30 and above is  considered obese.   Maintain normal blood lipids and cholesterol levels by exercising and minimizing your intake of saturated fat. Eat a balanced diet with plenty of fruit and vegetables. Blood tests for lipids and cholesterol should begin at age 20 and be repeated every 5 years. If your lipid or cholesterol levels are high, you are over 50, or you are at high risk for heart disease, you may need your cholesterol levels checked more frequently.Ongoing high lipid and cholesterol levels should be treated with medicines if diet and exercise are not effective.   If you smoke, find out from your caregiver how to quit. If you do not use tobacco, do not start.   If you are pregnant, do not drink alcohol. If you are breastfeeding, be very cautious about drinking alcohol. If you are not pregnant and choose to drink alcohol, do not exceed 1 drink per day. One drink is considered to be 12 ounces (355 mL) of beer, 5 ounces (148 mL) of wine, or 1.5 ounces (44 mL) of liquor.   Avoid use of street drugs. Do not share needles with anyone. Ask for help if you need support or instructions about stopping the use of drugs.   High blood pressure causes heart disease and increases the risk of stroke. Your blood pressure should be checked at least every 1 to 2 years. Ongoing high blood pressure should be treated with medicines if weight loss and exercise are not effective.   If you are 55 to 53   years old, ask your caregiver if you should take aspirin to prevent strokes.   Diabetes screening involves taking a blood sample to check your fasting blood sugar level. This should be done once every 3 years, after age 45, if you are within normal weight and without risk factors for diabetes. Testing should be considered at a younger age or be carried out more frequently if you are overweight and have at least 1 risk factor for diabetes.   Breast cancer screening is essential preventive care for women. You should practice "breast  self-awareness." This means understanding the normal appearance and feel of your breasts and may include breast self-examination. Any changes detected, no matter how small, should be reported to a caregiver. Women in their 20s and 30s should have a clinical breast exam (CBE) by a caregiver as part of a regular health exam every 1 to 3 years. After age 40, women should have a CBE every year. Starting at age 40, women should consider having a mammography (breast X-ray test) every year. Women who have a family history of breast cancer should talk to their caregiver about genetic screening. Women at a high risk of breast cancer should talk to their caregivers about having magnetic resonance imaging (MRI) and a mammography every year.   The Pap test is a screening test for cervical cancer. A Pap test can show cell changes on the cervix that might become cervical cancer if left untreated. A Pap test is a procedure in which cells are obtained and examined from the lower end of the uterus (cervix).   Women should have a Pap test starting at age 21.   Between ages 21 and 29, Pap tests should be repeated every 2 years.   Beginning at age 30, you should have a Pap test every 3 years as long as the past 3 Pap tests have been normal.   Some women have medical problems that increase the chance of getting cervical cancer. Talk to your caregiver about these problems. It is especially important to talk to your caregiver if a new problem develops soon after your last Pap test. In these cases, your caregiver may recommend more frequent screening and Pap tests.   The above recommendations are the same for women who have or have not gotten the vaccine for human papillomavirus (HPV).   If you had a hysterectomy for a problem that was not cancer or a condition that could lead to cancer, then you no longer need Pap tests. Even if you no longer need a Pap test, a regular exam is a good idea to make sure no other problems are  starting.   If you are between ages 65 and 70, and you have had normal Pap tests going back 10 years, you no longer need Pap tests. Even if you no longer need a Pap test, a regular exam is a good idea to make sure no other problems are starting.   If you have had past treatment for cervical cancer or a condition that could lead to cancer, you need Pap tests and screening for cancer for at least 20 years after your treatment.   If Pap tests have been discontinued, risk factors (such as a new sexual partner) need to be reassessed to determine if screening should be resumed.   The HPV test is an additional test that may be used for cervical cancer screening. The HPV test looks for the virus that can cause the cell changes on the cervix.   The cells collected during the Pap test can be tested for HPV. The HPV test could be used to screen women aged 30 years and older, and should be used in women of any age who have unclear Pap test results. After the age of 30, women should have HPV testing at the same frequency as a Pap test.   Colorectal cancer can be detected and often prevented. Most routine colorectal cancer screening begins at the age of 50 and continues through age 75. However, your caregiver may recommend screening at an earlier age if you have risk factors for colon cancer. On a yearly basis, your caregiver may provide home test kits to check for hidden blood in the stool. Use of a small camera at the end of a tube, to directly examine the colon (sigmoidoscopy or colonoscopy), can detect the earliest forms of colorectal cancer. Talk to your caregiver about this at age 50, when routine screening begins. Direct examination of the colon should be repeated every 5 to 10 years through age 75, unless early forms of pre-cancerous polyps or small growths are found.   Hepatitis C blood testing is recommended for all people born from 1945 through 1965 and any individual with known risks for hepatitis C.    Practice safe sex. Use condoms and avoid high-risk sexual practices to reduce the spread of sexually transmitted infections (STIs). STIs include gonorrhea, chlamydia, syphilis, trichomonas, herpes, HPV, and human immunodeficiency virus (HIV). Herpes, HIV, and HPV are viral illnesses that have no cure. They can result in disability, cancer, and death. Sexually active women aged 25 and younger should be checked for chlamydia. Older women with new or multiple partners should also be tested for chlamydia. Testing for other STIs is recommended if you are sexually active and at increased risk.   Osteoporosis is a disease in which the bones lose minerals and strength with aging. This can result in serious bone fractures. The risk of osteoporosis can be identified using a bone density scan. Women ages 65 and over and women at risk for fractures or osteoporosis should discuss screening with their caregivers. Ask your caregiver whether you should take a calcium supplement or vitamin D to reduce the rate of osteoporosis.   Menopause can be associated with physical symptoms and risks. Hormone replacement therapy is available to decrease symptoms and risks. You should talk to your caregiver about whether hormone replacement therapy is right for you.   Use sunscreen with sun protection factor (SPF) of 30 or more. Apply sunscreen liberally and repeatedly throughout the day. You should seek shade when your shadow is shorter than you. Protect yourself by wearing long sleeves, pants, a wide-brimmed hat, and sunglasses year round, whenever you are outdoors.   Once a month, do a whole body skin exam, using a mirror to look at the skin on your back. Notify your caregiver of new moles, moles that have irregular borders, moles that are larger than a pencil eraser, or moles that have changed in shape or color.   Stay current with required immunizations.   Influenza. You need a dose every fall (or winter). The composition of  the flu vaccine changes each year, so being vaccinated once is not enough.   Pneumococcal polysaccharide. You need 1 to 2 doses if you smoke cigarettes or if you have certain chronic medical conditions. You need 1 dose at age 65 (or older) if you have never been vaccinated.   Tetanus, diphtheria, pertussis (Tdap, Td). Get 1 dose of   Tdap vaccine if you are younger than age 65, are over 65 and have contact with an infant, are a healthcare worker, are pregnant, or simply want to be protected from whooping cough. After that, you need a Td booster dose every 10 years. Consult your caregiver if you have not had at least 3 tetanus and diphtheria-containing shots sometime in your life or have a deep or dirty wound.   HPV. You need this vaccine if you are a woman age 26 or younger. The vaccine is given in 3 doses over 6 months.   Measles, mumps, rubella (MMR). You need at least 1 dose of MMR if you were born in 1957 or later. You may also need a second dose.   Meningococcal. If you are age 19 to 21 and a first-year college student living in a residence hall, or have one of several medical conditions, you need to get vaccinated against meningococcal disease. You may also need additional booster doses.   Zoster (shingles). If you are age 60 or older, you should get this vaccine.   Varicella (chickenpox). If you have never had chickenpox or you were vaccinated but received only 1 dose, talk to your caregiver to find out if you need this vaccine.   Hepatitis A. You need this vaccine if you have a specific risk factor for hepatitis A virus infection or you simply wish to be protected from this disease. The vaccine is usually given as 2 doses, 6 to 18 months apart.   Hepatitis B. You need this vaccine if you have a specific risk factor for hepatitis B virus infection or you simply wish to be protected from this disease. The vaccine is given in 3 doses, usually over 6 months.  Preventive Services /  Frequency Ages 19 to 39  Blood pressure check.** / Every 1 to 2 years.   Lipid and cholesterol check.** / Every 5 years beginning at age 20.   Clinical breast exam.** / Every 3 years for women in their 20s and 30s.   Pap test.** / Every 2 years from ages 21 through 29. Every 3 years starting at age 30 through age 65 or 70 with a history of 3 consecutive normal Pap tests.   HPV screening.** / Every 3 years from ages 30 through ages 65 to 70 with a history of 3 consecutive normal Pap tests.   Hepatitis C blood test.** / For any individual with known risks for hepatitis C.   Skin self-exam. / Monthly.   Influenza immunization.** / Every year.   Pneumococcal polysaccharide immunization.** / 1 to 2 doses if you smoke cigarettes or if you have certain chronic medical conditions.   Tetanus, diphtheria, pertussis (Tdap, Td) immunization. / A one-time dose of Tdap vaccine. After that, you need a Td booster dose every 10 years.   HPV immunization. / 3 doses over 6 months, if you are 26 and younger.   Measles, mumps, rubella (MMR) immunization. / You need at least 1 dose of MMR if you were born in 1957 or later. You may also need a second dose.   Meningococcal immunization. / 1 dose if you are age 19 to 21 and a first-year college student living in a residence hall, or have one of several medical conditions, you need to get vaccinated against meningococcal disease. You may also need additional booster doses.   Varicella immunization.** / Consult your caregiver.   Hepatitis A immunization.** / Consult your caregiver. 2 doses, 6 to 18 months   apart.   Hepatitis B immunization.** / Consult your caregiver. 3 doses usually over 6 months.  Ages 40 to 64  Blood pressure check.** / Every 1 to 2 years.   Lipid and cholesterol check.** / Every 5 years beginning at age 20.   Clinical breast exam.** / Every year after age 40.   Mammogram.** / Every year beginning at age 40 and continuing for as  long as you are in good health. Consult with your caregiver.   Pap test.** / Every 3 years starting at age 30 through age 65 or 70 with a history of 3 consecutive normal Pap tests.   HPV screening.** / Every 3 years from ages 30 through ages 65 to 70 with a history of 3 consecutive normal Pap tests.   Fecal occult blood test (FOBT) of stool. / Every year beginning at age 50 and continuing until age 75. You may not need to do this test if you get a colonoscopy every 10 years.   Flexible sigmoidoscopy or colonoscopy.** / Every 5 years for a flexible sigmoidoscopy or every 10 years for a colonoscopy beginning at age 50 and continuing until age 75.   Hepatitis C blood test.** / For all people born from 1945 through 1965 and any individual with known risks for hepatitis C.   Skin self-exam. / Monthly.   Influenza immunization.** / Every year.   Pneumococcal polysaccharide immunization.** / 1 to 2 doses if you smoke cigarettes or if you have certain chronic medical conditions.   Tetanus, diphtheria, pertussis (Tdap, Td) immunization.** / A one-time dose of Tdap vaccine. After that, you need a Td booster dose every 10 years.   Measles, mumps, rubella (MMR) immunization. / You need at least 1 dose of MMR if you were born in 1957 or later. You may also need a second dose.   Varicella immunization.** / Consult your caregiver.   Meningococcal immunization.** / Consult your caregiver.   Hepatitis A immunization.** / Consult your caregiver. 2 doses, 6 to 18 months apart.   Hepatitis B immunization.** / Consult your caregiver. 3 doses, usually over 6 months.  Ages 65 and over  Blood pressure check.** / Every 1 to 2 years.   Lipid and cholesterol check.** / Every 5 years beginning at age 20.   Clinical breast exam.** / Every year after age 40.   Mammogram.** / Every year beginning at age 40 and continuing for as long as you are in good health. Consult with your caregiver.   Pap test.** /  Every 3 years starting at age 30 through age 65 or 70 with a 3 consecutive normal Pap tests. Testing can be stopped between 65 and 70 with 3 consecutive normal Pap tests and no abnormal Pap or HPV tests in the past 10 years.   HPV screening.** / Every 3 years from ages 30 through ages 65 or 70 with a history of 3 consecutive normal Pap tests. Testing can be stopped between 65 and 70 with 3 consecutive normal Pap tests and no abnormal Pap or HPV tests in the past 10 years.   Fecal occult blood test (FOBT) of stool. / Every year beginning at age 50 and continuing until age 75. You may not need to do this test if you get a colonoscopy every 10 years.   Flexible sigmoidoscopy or colonoscopy.** / Every 5 years for a flexible sigmoidoscopy or every 10 years for a colonoscopy beginning at age 50 and continuing until age 75.   Hepatitis   C blood test.** / For all people born from 1945 through 1965 and any individual with known risks for hepatitis C.   Osteoporosis screening.** / A one-time screening for women ages 65 and over and women at risk for fractures or osteoporosis.   Skin self-exam. / Monthly.   Influenza immunization.** / Every year.   Pneumococcal polysaccharide immunization.** / 1 dose at age 65 (or older) if you have never been vaccinated.   Tetanus, diphtheria, pertussis (Tdap, Td) immunization. / A one-time dose of Tdap vaccine if you are over 65 and have contact with an infant, are a healthcare worker, or simply want to be protected from whooping cough. After that, you need a Td booster dose every 10 years.   Varicella immunization.** / Consult your caregiver.   Meningococcal immunization.** / Consult your caregiver.   Hepatitis A immunization.** / Consult your caregiver. 2 doses, 6 to 18 months apart.   Hepatitis B immunization.** / Check with your caregiver. 3 doses, usually over 6 months.  ** Family history and personal history of risk and conditions may change your caregiver's  recommendations. Document Released: 06/03/2001 Document Revised: 03/27/2011 Document Reviewed: 09/02/2010 [employer] Patient Information 2012 [employer]. 

## 2011-11-06 NOTE — Assessment & Plan Note (Signed)
Check labs 

## 2011-11-06 NOTE — Progress Notes (Signed)
Subjective:     Tanya Nelson is a 51 y.o. female and is here for a comprehensive physical exam. The patient reports problems - ears are better but still feels clogged at times.   She did not like the dymista.  It irritated her throat.Marland Kitchen  History   Social History  . Marital Status: Married    Spouse Name: N/A    Number of Children: N/A  . Years of Education: N/A   Occupational History  . [employer]    Social History Main Topics  . Smoking status: Never Smoker   . Smokeless tobacco: Never Used  . Alcohol Use: Yes     rare  . Drug Use: No  . Sexually Active: Yes -- Female partner(s)   Other Topics Concern  . Not on file   Social History Narrative   Exercise--swimming , walking  2x a week each   Health Maintenance  Topic Date Due  . Influenza Vaccine  01/20/2012  . Mammogram  05/20/2013  . Pap Smear  11/06/2014  . Tetanus/tdap  04/25/2019  . Colonoscopy  06/05/2020    The following portions of the patient's history were reviewed and updated as appropriate: allergies, current medications, past family history, past medical history, past social history, past surgical history and problem list.  Review of Systems Review of Systems  Constitutional: Negative for activity change, appetite change and fatigue.  HENT: Negative for hearing loss, congestion, tinnitus and ear discharge.  dentist q29m Eyes: Negative for visual disturbance (see optho q1y -- vision corrected to 20/20 with glasses).  Respiratory: Negative for cough, chest tightness and shortness of breath.   Cardiovascular: Negative for chest pain, palpitations and leg swelling.  Gastrointestinal: Negative for abdominal pain, diarrhea, constipation and abdominal distention.  Genitourinary: Negative for urgency, frequency, decreased urine volume and difficulty urinating.  Musculoskeletal: Negative for back pain, arthralgias and gait problem.  Skin: Negative for color change, pallor and rash.  Neurological: Negative for dizziness,  light-headedness, numbness and headaches.  Hematological: Negative for adenopathy. Does not bruise/bleed easily.  Psychiatric/Behavioral: Negative for suicidal ideas, confusion, sleep disturbance, self-injury, dysphoric mood, decreased concentration and agitation.       Objective:    BP 112/68  Pulse 60  Temp 98.7 F (37.1 C) (Oral)  Ht 5' 7.75" (1.721 m)  Wt 165 lb (74.844 kg)  BMI 25.27 kg/m2  SpO2 97% General appearance: alert, cooperative, appears stated age and no distress Head: Normocephalic, without obvious abnormality, atraumatic Eyes: conjunctivae/corneas clear. PERRL, EOM's intact. Fundi benign. Ears: normal TM's and external ear canals both ears Nose: Nares normal. Septum midline. Mucosa normal. No drainage or sinus tenderness. Throat: lips, mucosa, and tongue normal; teeth and gums normal Neck: no adenopathy, no carotid bruit, no JVD, supple, symmetrical, trachea midline and thyroid not enlarged, symmetric, no tenderness/mass/nodules Back: symmetric, no curvature. ROM normal. No CVA tenderness. Lungs: clear to auscultation bilaterally Breasts: normal appearance, no masses or tenderness Heart: regular rate and rhythm, S1, S2 normal, no murmur, click, rub or gallop Abdomen: soft, non-tender; bowel sounds normal; no masses,  no organomegaly Pelvic: external genitalia normal, no adnexal masses or tenderness, rectovaginal septum normal, uterus surgically absent and vagina normal without discharge Extremities: extremities normal, atraumatic, no cyanosis or edema Pulses: 2+ and symmetric Skin: Skin color, texture, turgor normal. No rashes or lesions Lymph nodes: Cervical, supraclavicular, and axillary nodes normal. Neurologic: Alert and oriented X 3, normal strength and tone. Normal symmetric reflexes. Normal coordination and gait psych-- no anxiety or depression  Assessment:    Healthy female exam.      Plan:  Check labs ghm utd   See After Visit Summary for  Counseling Recommendations

## 2011-11-19 ENCOUNTER — Telehealth: Payer: Self-pay | Admitting: *Deleted

## 2011-11-19 NOTE — Telephone Encounter (Signed)
Prior Auth approved 11-18-11 until 08-13-14, pharmacy faxed approval letter scan to chart.

## 2012-03-28 ENCOUNTER — Other Ambulatory Visit: Payer: Self-pay | Admitting: Family Medicine

## 2012-05-26 ENCOUNTER — Other Ambulatory Visit: Payer: Self-pay | Admitting: Family Medicine

## 2012-06-05 ENCOUNTER — Other Ambulatory Visit: Payer: Self-pay

## 2012-06-07 ENCOUNTER — Telehealth: Payer: Self-pay | Admitting: Family Medicine

## 2012-06-07 ENCOUNTER — Other Ambulatory Visit: Payer: Self-pay | Admitting: Family Medicine

## 2012-06-07 DIAGNOSIS — Z780 Asymptomatic menopausal state: Secondary | ICD-10-CM

## 2012-06-07 DIAGNOSIS — Z1231 Encounter for screening mammogram for malignant neoplasm of breast: Secondary | ICD-10-CM

## 2012-06-07 NOTE — Telephone Encounter (Signed)
Patient aware and voiced understanding.      KP 

## 2012-06-07 NOTE — Telephone Encounter (Signed)
Order is in.

## 2012-06-07 NOTE — Telephone Encounter (Signed)
Please advise      KP 

## 2012-06-07 NOTE — Telephone Encounter (Signed)
Patient states she needs order for bone denisty sent to [facility]. She wants to make the appt herself and go at the same time as her mammogram. Please call pt when order sent.

## 2012-06-15 ENCOUNTER — Other Ambulatory Visit (INDEPENDENT_AMBULATORY_CARE_PROVIDER_SITE_OTHER): Payer: BC Managed Care – PPO

## 2012-06-15 DIAGNOSIS — E785 Hyperlipidemia, unspecified: Secondary | ICD-10-CM

## 2012-06-15 LAB — LIPID PANEL
Cholesterol: 174 mg/dL (ref 0–200)
HDL: 52.1 mg/dL (ref >39.00)
Total CHOL/HDL Ratio: 3
Triglycerides: 134 mg/dL (ref 0.0–149.0)

## 2012-06-15 LAB — HEPATIC FUNCTION PANEL
ALT: 25 U/L (ref 0–35)
AST: 25 U/L (ref 0–37)
Albumin: 4.2 g/dL (ref 3.5–5.2)
Alkaline Phosphatase: 48 U/L (ref 39–117)
Total Protein: 7.7 g/dL (ref 6.0–8.3)

## 2012-06-20 ENCOUNTER — Other Ambulatory Visit: Payer: Self-pay | Admitting: Family Medicine

## 2012-06-27 ENCOUNTER — Other Ambulatory Visit: Payer: Self-pay | Admitting: Family Medicine

## 2012-07-07 ENCOUNTER — Ambulatory Visit
Admission: RE | Admit: 2012-07-07 | Discharge: 2012-07-07 | Disposition: A | Discharge status: Home / Self Care | Payer: BC Managed Care – PPO | Source: Ambulatory Visit | Attending: Family Medicine | Admitting: Family Medicine

## 2012-07-07 DIAGNOSIS — Z1231 Encounter for screening mammogram for malignant neoplasm of breast: Secondary | ICD-10-CM

## 2012-07-07 DIAGNOSIS — Z780 Asymptomatic menopausal state: Secondary | ICD-10-CM

## 2012-09-09 ENCOUNTER — Other Ambulatory Visit: Payer: Self-pay | Admitting: Family Medicine

## 2012-10-07 ENCOUNTER — Other Ambulatory Visit: Payer: Self-pay | Admitting: Family Medicine

## 2012-11-03 ENCOUNTER — Other Ambulatory Visit: Payer: Self-pay | Admitting: Family Medicine

## 2012-11-10 ENCOUNTER — Encounter: Payer: BC Managed Care – PPO | Admitting: Family Medicine

## 2012-12-05 ENCOUNTER — Other Ambulatory Visit: Payer: Self-pay | Admitting: Family Medicine

## 2012-12-27 ENCOUNTER — Other Ambulatory Visit: Payer: Self-pay | Admitting: Family Medicine

## 2012-12-31 ENCOUNTER — Ambulatory Visit (INDEPENDENT_AMBULATORY_CARE_PROVIDER_SITE_OTHER): Payer: BC Managed Care – PPO | Admitting: Family Medicine

## 2012-12-31 ENCOUNTER — Encounter: Payer: Self-pay | Admitting: Family Medicine

## 2012-12-31 VITALS — BP 112/72 | HR 54 | Temp 98.4°F | Ht 67.75 in | Wt 167.4 lb

## 2012-12-31 DIAGNOSIS — Z0000 Encounter for general adult medical examination without abnormal findings: Secondary | ICD-10-CM

## 2012-12-31 DIAGNOSIS — E785 Hyperlipidemia, unspecified: Secondary | ICD-10-CM

## 2012-12-31 DIAGNOSIS — Z23 Encounter for immunization: Secondary | ICD-10-CM

## 2012-12-31 LAB — POCT URINALYSIS DIPSTICK
Ketones, UA: NEGATIVE
Protein, UA: NEGATIVE
Spec Grav, UA: 1.01
pH, UA: 6

## 2012-12-31 LAB — LIPID PANEL
Cholesterol: 164 mg/dL (ref 0–200)
HDL: 50.1 mg/dL (ref >39.00)
Triglycerides: 116 mg/dL (ref 0.0–149.0)
VLDL: 23.2 mg/dL (ref 0.0–40.0)

## 2012-12-31 LAB — BASIC METABOLIC PANEL
CO2: 29 mEq/L (ref 19–32)
Calcium: 9.1 mg/dL (ref 8.4–10.5)
Glucose, Bld: 90 mg/dL (ref 70–99)
Sodium: 137 mEq/L (ref 135–145)

## 2012-12-31 LAB — CBC WITH DIFFERENTIAL/PLATELET
Basophils Absolute: 0 10*3/uL (ref 0.0–0.1)
Eosinophils Absolute: 0.1 10*3/uL (ref 0.0–0.7)
HCT: 40.9 % (ref 36.0–46.0)
Hemoglobin: 13.9 g/dL (ref 12.0–15.0)
Lymphocytes Relative: 38.4 % (ref 12.0–46.0)
Lymphs Abs: 1.8 10*3/uL (ref 0.7–4.0)
MCHC: 34 g/dL (ref 30.0–36.0)
Neutro Abs: 2.2 10*3/uL (ref 1.4–7.7)
RDW: 12.6 % (ref 11.5–14.6)

## 2012-12-31 LAB — HEPATIC FUNCTION PANEL: Albumin: 4.1 g/dL (ref 3.5–5.2)

## 2012-12-31 NOTE — Assessment & Plan Note (Addendum)
Check labs con't meds 

## 2012-12-31 NOTE — Progress Notes (Signed)
Subjective:     Tanya Nelson is a 52 y.o. female and is here for a comprehensive physical exam. The patient reports no problems.  History   Social History  . Marital Status: Married    Spouse Name: N/A    Number of Children: N/A  . Years of Education: N/A   Occupational History  . [employer]    Social History Main Topics  . Smoking status: Never Smoker   . Smokeless tobacco: Never Used  . Alcohol Use: Yes     Comment: rare  . Drug Use: No  . Sexual Activity: Yes    Partners: Male   Other Topics Concern  . Not on file   Social History Narrative   Exercise--swimming , walking  2x a week each   Health Maintenance  Topic Date Due  . Influenza Vaccine  11/19/2012  . Mammogram  07/08/2014  . Pap Smear  11/06/2014  . Tetanus/tdap  04/25/2019  . Colonoscopy  06/05/2020    The following portions of the patient's history were reviewed and updated as appropriate:  She  has a past medical history of Heart murmur; Cancer; and Acute lymphadenitis. She  does not have any pertinent problems on file. She  has past surgical history that includes Abdominal hysterectomy and Mohs surgery (08/2008). Her family history includes Hypertension in her mother and another family member; Prostate cancer in her father. She  reports that she has never smoked. She has never used smokeless tobacco. She reports that  drinks alcohol. She reports that she does not use illicit drugs. She has a current medication list which includes the following prescription(s): aspirin ec, beclomethasone dipropionate, estradiol, multivitamin, and pravastatin. Current Outpatient Prescriptions on File Prior to Visit  Medication Sig Dispense Refill  . aspirin EC 81 MG EC tablet Take 81 mg by mouth daily.        . Beclomethasone Dipropionate (QNASL) 80 MCG/ACT AERS Place 2 sprays into the nose daily.  1 Inhaler  5  . estradiol (CLIMARA - DOSED IN MG/24 HR) 0.05 mg/24hr patch APPLY 1 PATCH TO THE SKIN EVERY WEEK  4 patch  0   . Multiple Vitamin (MULTIVITAMIN) tablet Take 1 tablet by mouth daily.        . pravastatin (PRAVACHOL) 40 MG tablet TAKE 1 TABLET BY MOUTH DAILY  30 tablet  0   No current facility-administered medications on file prior to visit.   She has No Known Allergies..  Review of Systems Review of Systems  Constitutional: Negative for activity change, appetite change and fatigue.  HENT: Negative for hearing loss, congestion, tinnitus and ear discharge.  dentist q29m Eyes: Negative for visual disturbance (see optho q1y -- vision corrected to 20/20 with glasses).  Respiratory: Negative for cough, chest tightness and shortness of breath.   Cardiovascular: Negative for chest pain, palpitations and leg swelling.  Gastrointestinal: Negative for abdominal pain, diarrhea, constipation and abdominal distention.  Genitourinary: Negative for urgency, frequency, decreased urine volume and difficulty urinating.  Musculoskeletal: Negative for back pain, arthralgias and gait problem.  Skin: Negative for color change, pallor and rash.  Neurological: Negative for dizziness, light-headedness, numbness and headaches.  Hematological: Negative for adenopathy. Does not bruise/bleed easily.  Psychiatric/Behavioral: Negative for suicidal ideas, confusion, sleep disturbance, self-injury, dysphoric mood, decreased concentration and agitation.       Objective:    BP 112/72  Pulse 54  Temp(Src) 98.4 F (36.9 C) (Oral)  Ht 5' 7.75" (1.721 m)  Wt 167 lb 6.4 oz (  75.932 kg)  BMI 25.64 kg/m2  SpO2 97% General appearance: alert, cooperative, appears stated age and no distress Head: Normocephalic, without obvious abnormality, atraumatic Eyes: conjunctivae/corneas clear. PERRL, EOM's intact. Fundi benign. Ears: normal TM's and external ear canals both ears Nose: Nares normal. Septum midline. Mucosa normal. No drainage or sinus tenderness. Throat: lips, mucosa, and tongue normal; teeth and gums normal Neck: no  adenopathy, no carotid bruit, no JVD, supple, symmetrical, trachea midline and thyroid not enlarged, symmetric, no tenderness/mass/nodules Back: symmetric, no curvature. ROM normal. No CVA tenderness. Lungs: clear to auscultation bilaterally Breasts: normal appearance, no masses or tenderness Heart: regular rate and rhythm, S1, S2 normal, no murmur, click, rub or gallop Abdomen: soft, non-tender; bowel sounds normal; no masses,  no organomegaly Pelvic: deferred Extremities: extremities normal, atraumatic, no cyanosis or edema Pulses: 2+ and symmetric Skin: Skin color, texture, turgor normal. No rashes or lesions Lymph nodes: Cervical, supraclavicular, and axillary nodes normal. Neurologic: Alert and oriented X 3, normal strength and tone. Normal symmetric reflexes. Normal coordination and gait Psych-- no depression, no anxiety      Assessment:    Healthy female exam.      Plan:    ghm utd Check labs See After Visit Summary for Counseling Recommendations

## 2012-12-31 NOTE — Patient Instructions (Signed)
Preventive Care for Adults, Female A healthy lifestyle and preventive care can promote health and wellness. Preventive health guidelines for women include the following key practices.  A routine yearly physical is a good way to check with your caregiver about your health and preventive screening. It is a chance to share any concerns and updates on your health, and to receive a thorough exam.  Visit your dentist for a routine exam and preventive care every 6 months. Brush your teeth twice a day and floss once a day. Good oral hygiene prevents tooth decay and gum disease.  The frequency of eye exams is based on your age, health, family medical history, use of contact lenses, and other factors. Follow your caregiver's recommendations for frequency of eye exams.  Eat a healthy diet. Foods like vegetables, fruits, whole grains, low-fat dairy products, and lean protein foods contain the nutrients you need without too many calories. Decrease your intake of foods high in solid fats, added sugars, and salt. Eat the right amount of calories for you.Get information about a proper diet from your caregiver, if necessary.  Regular physical exercise is one of the most important things you can do for your health. Most adults should get at least 150 minutes of moderate-intensity exercise (any activity that increases your heart rate and causes you to sweat) each week. In addition, most adults need muscle-strengthening exercises on 2 or more days a week.  Maintain a healthy weight. The body mass index (BMI) is a screening tool to identify possible weight problems. It provides an estimate of body fat based on height and weight. Your caregiver can help determine your BMI, and can help you achieve or maintain a healthy weight.For adults 20 years and older:  A BMI below 18.5 is considered underweight.  A BMI of 18.5 to 24.9 is normal.  A BMI of 25 to 29.9 is considered overweight.  A BMI of 30 and above is  considered obese.  Maintain normal blood lipids and cholesterol levels by exercising and minimizing your intake of saturated fat. Eat a balanced diet with plenty of fruit and vegetables. Blood tests for lipids and cholesterol should begin at age 20 and be repeated every 5 years. If your lipid or cholesterol levels are high, you are over 50, or you are at high risk for heart disease, you may need your cholesterol levels checked more frequently.Ongoing high lipid and cholesterol levels should be treated with medicines if diet and exercise are not effective.  If you smoke, find out from your caregiver how to quit. If you do not use tobacco, do not start.  If you are pregnant, do not drink alcohol. If you are breastfeeding, be very cautious about drinking alcohol. If you are not pregnant and choose to drink alcohol, do not exceed 1 drink per day. One drink is considered to be 12 ounces (355 mL) of beer, 5 ounces (148 mL) of wine, or 1.5 ounces (44 mL) of liquor.  Avoid use of street drugs. Do not share needles with anyone. Ask for help if you need support or instructions about stopping the use of drugs.  High blood pressure causes heart disease and increases the risk of stroke. Your blood pressure should be checked at least every 1 to 2 years. Ongoing high blood pressure should be treated with medicines if weight loss and exercise are not effective.  If you are 55 to 54 years old, ask your caregiver if you should take aspirin to prevent strokes.  Diabetes   screening involves taking a blood sample to check your fasting blood sugar level. This should be done once every 3 years, after age 45, if you are within normal weight and without risk factors for diabetes. Testing should be considered at a younger age or be carried out more frequently if you are overweight and have at least 1 risk factor for diabetes.  Breast cancer screening is essential preventive care for women. You should practice "breast  self-awareness." This means understanding the normal appearance and feel of your breasts and may include breast self-examination. Any changes detected, no matter how small, should be reported to a caregiver. Women in their 20s and 30s should have a clinical breast exam (CBE) by a caregiver as part of a regular health exam every 1 to 3 years. After age 40, women should have a CBE every year. Starting at age 40, women should consider having a mammography (breast X-ray test) every year. Women who have a family history of breast cancer should talk to their caregiver about genetic screening. Women at a high risk of breast cancer should talk to their caregivers about having magnetic resonance imaging (MRI) and a mammography every year.  The Pap test is a screening test for cervical cancer. A Pap test can show cell changes on the cervix that might become cervical cancer if left untreated. A Pap test is a procedure in which cells are obtained and examined from the lower end of the uterus (cervix).  Women should have a Pap test starting at age 21.  Between ages 21 and 29, Pap tests should be repeated every 2 years.  Beginning at age 30, you should have a Pap test every 3 years as long as the past 3 Pap tests have been normal.  Some women have medical problems that increase the chance of getting cervical cancer. Talk to your caregiver about these problems. It is especially important to talk to your caregiver if a new problem develops soon after your last Pap test. In these cases, your caregiver may recommend more frequent screening and Pap tests.  The above recommendations are the same for women who have or have not gotten the vaccine for human papillomavirus (HPV).  If you had a hysterectomy for a problem that was not cancer or a condition that could lead to cancer, then you no longer need Pap tests. Even if you no longer need a Pap test, a regular exam is a good idea to make sure no other problems are  starting.  If you are between ages 65 and 70, and you have had normal Pap tests going back 10 years, you no longer need Pap tests. Even if you no longer need a Pap test, a regular exam is a good idea to make sure no other problems are starting.  If you have had past treatment for cervical cancer or a condition that could lead to cancer, you need Pap tests and screening for cancer for at least 20 years after your treatment.  If Pap tests have been discontinued, risk factors (such as a new sexual partner) need to be reassessed to determine if screening should be resumed.  The HPV test is an additional test that may be used for cervical cancer screening. The HPV test looks for the virus that can cause the cell changes on the cervix. The cells collected during the Pap test can be tested for HPV. The HPV test could be used to screen women aged 30 years and older, and should   be used in women of any age who have unclear Pap test results. After the age of 30, women should have HPV testing at the same frequency as a Pap test.  Colorectal cancer can be detected and often prevented. Most routine colorectal cancer screening begins at the age of 50 and continues through age 75. However, your caregiver may recommend screening at an earlier age if you have risk factors for colon cancer. On a yearly basis, your caregiver may provide home test kits to check for hidden blood in the stool. Use of a small camera at the end of a tube, to directly examine the colon (sigmoidoscopy or colonoscopy), can detect the earliest forms of colorectal cancer. Talk to your caregiver about this at age 50, when routine screening begins. Direct examination of the colon should be repeated every 5 to 10 years through age 75, unless early forms of pre-cancerous polyps or small growths are found.  Hepatitis C blood testing is recommended for all people born from 1945 through 1965 and any individual with known risks for hepatitis C.  Practice  safe sex. Use condoms and avoid high-risk sexual practices to reduce the spread of sexually transmitted infections (STIs). STIs include gonorrhea, chlamydia, syphilis, trichomonas, herpes, HPV, and human immunodeficiency virus (HIV). Herpes, HIV, and HPV are viral illnesses that have no cure. They can result in disability, cancer, and death. Sexually active women aged 25 and younger should be checked for chlamydia. Older women with new or multiple partners should also be tested for chlamydia. Testing for other STIs is recommended if you are sexually active and at increased risk.  Osteoporosis is a disease in which the bones lose minerals and strength with aging. This can result in serious bone fractures. The risk of osteoporosis can be identified using a bone density scan. Women ages 65 and over and women at risk for fractures or osteoporosis should discuss screening with their caregivers. Ask your caregiver whether you should take a calcium supplement or vitamin D to reduce the rate of osteoporosis.  Menopause can be associated with physical symptoms and risks. Hormone replacement therapy is available to decrease symptoms and risks. You should talk to your caregiver about whether hormone replacement therapy is right for you.  Use sunscreen with sun protection factor (SPF) of 30 or more. Apply sunscreen liberally and repeatedly throughout the day. You should seek shade when your shadow is shorter than you. Protect yourself by wearing long sleeves, pants, a wide-brimmed hat, and sunglasses year round, whenever you are outdoors.  Once a month, do a whole body skin exam, using a mirror to look at the skin on your back. Notify your caregiver of new moles, moles that have irregular borders, moles that are larger than a pencil eraser, or moles that have changed in shape or color.  Stay current with required immunizations.  Influenza. You need a dose every fall (or winter). The composition of the flu vaccine  changes each year, so being vaccinated once is not enough.  Pneumococcal polysaccharide. You need 1 to 2 doses if you smoke cigarettes or if you have certain chronic medical conditions. You need 1 dose at age 65 (or older) if you have never been vaccinated.  Tetanus, diphtheria, pertussis (Tdap, Td). Get 1 dose of Tdap vaccine if you are younger than age 65, are over 65 and have contact with an infant, are a healthcare worker, are pregnant, or simply want to be protected from whooping cough. After that, you need a Td   booster dose every 10 years. Consult your caregiver if you have not had at least 3 tetanus and diphtheria-containing shots sometime in your life or have a deep or dirty wound.  HPV. You need this vaccine if you are a woman age 26 or younger. The vaccine is given in 3 doses over 6 months.  Measles, mumps, rubella (MMR). You need at least 1 dose of MMR if you were born in 1957 or later. You may also need a second dose.  Meningococcal. If you are age 19 to 21 and a first-year college student living in a residence hall, or have one of several medical conditions, you need to get vaccinated against meningococcal disease. You may also need additional booster doses.  Zoster (shingles). If you are age 60 or older, you should get this vaccine.  Varicella (chickenpox). If you have never had chickenpox or you were vaccinated but received only 1 dose, talk to your caregiver to find out if you need this vaccine.  Hepatitis A. You need this vaccine if you have a specific risk factor for hepatitis A virus infection or you simply wish to be protected from this disease. The vaccine is usually given as 2 doses, 6 to 18 months apart.  Hepatitis B. You need this vaccine if you have a specific risk factor for hepatitis B virus infection or you simply wish to be protected from this disease. The vaccine is given in 3 doses, usually over 6 months. Preventive Services / Frequency Ages 19 to 39  Blood  pressure check.** / Every 1 to 2 years.  Lipid and cholesterol check.** / Every 5 years beginning at age 20.  Clinical breast exam.** / Every 3 years for women in their 20s and 30s.  Pap test.** / Every 2 years from ages 21 through 29. Every 3 years starting at age 30 through age 65 or 70 with a history of 3 consecutive normal Pap tests.  HPV screening.** / Every 3 years from ages 30 through ages 65 to 70 with a history of 3 consecutive normal Pap tests.  Hepatitis C blood test.** / For any individual with known risks for hepatitis C.  Skin self-exam. / Monthly.  Influenza immunization.** / Every year.  Pneumococcal polysaccharide immunization.** / 1 to 2 doses if you smoke cigarettes or if you have certain chronic medical conditions.  Tetanus, diphtheria, pertussis (Tdap, Td) immunization. / A one-time dose of Tdap vaccine. After that, you need a Td booster dose every 10 years.  HPV immunization. / 3 doses over 6 months, if you are 26 and younger.  Measles, mumps, rubella (MMR) immunization. / You need at least 1 dose of MMR if you were born in 1957 or later. You may also need a second dose.  Meningococcal immunization. / 1 dose if you are age 19 to 21 and a first-year college student living in a residence hall, or have one of several medical conditions, you need to get vaccinated against meningococcal disease. You may also need additional booster doses.  Varicella immunization.** / Consult your caregiver.  Hepatitis A immunization.** / Consult your caregiver. 2 doses, 6 to 18 months apart.  Hepatitis B immunization.** / Consult your caregiver. 3 doses usually over 6 months. Ages 40 to 64  Blood pressure check.** / Every 1 to 2 years.  Lipid and cholesterol check.** / Every 5 years beginning at age 20.  Clinical breast exam.** / Every year after age 40.  Mammogram.** / Every year beginning at age 40   and continuing for as long as you are in good health. Consult with your  caregiver.  Pap test.** / Every 3 years starting at age 30 through age 65 or 70 with a history of 3 consecutive normal Pap tests.  HPV screening.** / Every 3 years from ages 30 through ages 65 to 70 with a history of 3 consecutive normal Pap tests.  Fecal occult blood test (FOBT) of stool. / Every year beginning at age 50 and continuing until age 75. You may not need to do this test if you get a colonoscopy every 10 years.  Flexible sigmoidoscopy or colonoscopy.** / Every 5 years for a flexible sigmoidoscopy or every 10 years for a colonoscopy beginning at age 50 and continuing until age 75.  Hepatitis C blood test.** / For all people born from 1945 through 1965 and any individual with known risks for hepatitis C.  Skin self-exam. / Monthly.  Influenza immunization.** / Every year.  Pneumococcal polysaccharide immunization.** / 1 to 2 doses if you smoke cigarettes or if you have certain chronic medical conditions.  Tetanus, diphtheria, pertussis (Tdap, Td) immunization.** / A one-time dose of Tdap vaccine. After that, you need a Td booster dose every 10 years.  Measles, mumps, rubella (MMR) immunization. / You need at least 1 dose of MMR if you were born in 1957 or later. You may also need a second dose.  Varicella immunization.** / Consult your caregiver.  Meningococcal immunization.** / Consult your caregiver.  Hepatitis A immunization.** / Consult your caregiver. 2 doses, 6 to 18 months apart.  Hepatitis B immunization.** / Consult your caregiver. 3 doses, usually over 6 months. Ages 65 and over  Blood pressure check.** / Every 1 to 2 years.  Lipid and cholesterol check.** / Every 5 years beginning at age 20.  Clinical breast exam.** / Every year after age 40.  Mammogram.** / Every year beginning at age 40 and continuing for as long as you are in good health. Consult with your caregiver.  Pap test.** / Every 3 years starting at age 30 through age 65 or 70 with a 3  consecutive normal Pap tests. Testing can be stopped between 65 and 70 with 3 consecutive normal Pap tests and no abnormal Pap or HPV tests in the past 10 years.  HPV screening.** / Every 3 years from ages 30 through ages 65 or 70 with a history of 3 consecutive normal Pap tests. Testing can be stopped between 65 and 70 with 3 consecutive normal Pap tests and no abnormal Pap or HPV tests in the past 10 years.  Fecal occult blood test (FOBT) of stool. / Every year beginning at age 50 and continuing until age 75. You may not need to do this test if you get a colonoscopy every 10 years.  Flexible sigmoidoscopy or colonoscopy.** / Every 5 years for a flexible sigmoidoscopy or every 10 years for a colonoscopy beginning at age 50 and continuing until age 75.  Hepatitis C blood test.** / For all people born from 1945 through 1965 and any individual with known risks for hepatitis C.  Osteoporosis screening.** / A one-time screening for women ages 65 and over and women at risk for fractures or osteoporosis.  Skin self-exam. / Monthly.  Influenza immunization.** / Every year.  Pneumococcal polysaccharide immunization.** / 1 dose at age 65 (or older) if you have never been vaccinated.  Tetanus, diphtheria, pertussis (Tdap, Td) immunization. / A one-time dose of Tdap vaccine if you are over   65 and have contact with an infant, are a healthcare worker, or simply want to be protected from whooping cough. After that, you need a Td booster dose every 10 years.  Varicella immunization.** / Consult your caregiver.  Meningococcal immunization.** / Consult your caregiver.  Hepatitis A immunization.** / Consult your caregiver. 2 doses, 6 to 18 months apart.  Hepatitis B immunization.** / Check with your caregiver. 3 doses, usually over 6 months. ** Family history and personal history of risk and conditions may change your caregiver's recommendations. Document Released: 06/03/2001 Document Revised: 06/30/2011  Document Reviewed: 09/02/2010 [employer] Patient Information 2014 [employer].  

## 2013-01-01 ENCOUNTER — Other Ambulatory Visit: Payer: Self-pay | Admitting: Family Medicine

## 2013-01-24 ENCOUNTER — Other Ambulatory Visit: Payer: Self-pay | Admitting: Family Medicine

## 2013-03-14 ENCOUNTER — Other Ambulatory Visit: Payer: Self-pay | Admitting: Family Medicine

## 2013-07-20 ENCOUNTER — Other Ambulatory Visit: Payer: Self-pay | Admitting: Family Medicine

## 2013-08-22 ENCOUNTER — Other Ambulatory Visit: Payer: Self-pay

## 2013-08-22 ENCOUNTER — Other Ambulatory Visit: Payer: Self-pay | Admitting: Family Medicine

## 2013-08-22 DIAGNOSIS — Z1231 Encounter for screening mammogram for malignant neoplasm of breast: Secondary | ICD-10-CM

## 2013-08-24 ENCOUNTER — Telehealth: Payer: Self-pay | Admitting: Family Medicine

## 2013-08-24 DIAGNOSIS — E785 Hyperlipidemia, unspecified: Secondary | ICD-10-CM

## 2013-08-24 NOTE — Telephone Encounter (Signed)
Contacted pt to schedule apt for labs and follow up visit.  Left VM for return call.

## 2013-08-24 NOTE — Telephone Encounter (Signed)
Caller name:Kristyna Relation to pt: Call back number:[phone]  Reason for call:  Pt states that whenever she gets the RX pravastatin (PRAVACHOL) 40 MG tablet refilled, she has to come in for lab work.  Does she?  And if so, can we get the orders put in so we can get her scheduled.

## 2013-08-24 NOTE — Telephone Encounter (Signed)
Notes Recorded by Rosalita Chessman, DO on 12/31/2012 at 10:56 PM Cholesterol--- LDL goal < 100 , HDL >40, TG < 150. Diet and exercise will increase HDL and decrease LDL and TG. Fish, Fish Oil, Flaxseed oil will also help increase the HDL and decrease Triglycerides. Recheck labs in 6 months Recheck 6 months----272.4 Lipid, hep.   Labs are in. However the patient is due for a 6 mo follow up with Dr.Lowne. Please schedule.     KP

## 2013-09-01 ENCOUNTER — Ambulatory Visit
Admission: RE | Admit: 2013-09-01 | Discharge: 2013-09-01 | Disposition: A | Discharge status: Home / Self Care | Payer: BC Managed Care – PPO | Source: Ambulatory Visit

## 2013-09-01 DIAGNOSIS — Z1231 Encounter for screening mammogram for malignant neoplasm of breast: Secondary | ICD-10-CM

## 2013-09-19 ENCOUNTER — Other Ambulatory Visit: Payer: Self-pay | Admitting: Family Medicine

## 2013-10-18 ENCOUNTER — Other Ambulatory Visit: Payer: Self-pay | Admitting: Family Medicine

## 2013-11-17 ENCOUNTER — Other Ambulatory Visit: Payer: Self-pay | Admitting: Family Medicine

## 2013-12-01 ENCOUNTER — Other Ambulatory Visit: Payer: Self-pay | Admitting: Family Medicine

## 2013-12-18 ENCOUNTER — Other Ambulatory Visit: Payer: Self-pay | Admitting: Family Medicine

## 2014-01-01 ENCOUNTER — Other Ambulatory Visit: Payer: Self-pay | Admitting: Family Medicine

## 2014-01-04 ENCOUNTER — Encounter: Payer: Self-pay | Admitting: Family Medicine

## 2014-01-16 ENCOUNTER — Other Ambulatory Visit: Payer: Self-pay | Admitting: Family Medicine

## 2014-01-25 ENCOUNTER — Other Ambulatory Visit: Payer: Self-pay | Admitting: Family Medicine

## 2014-02-16 ENCOUNTER — Other Ambulatory Visit: Payer: Self-pay | Admitting: Family Medicine

## 2014-03-14 ENCOUNTER — Encounter: Payer: Self-pay | Admitting: Family Medicine

## 2014-03-14 ENCOUNTER — Ambulatory Visit (INDEPENDENT_AMBULATORY_CARE_PROVIDER_SITE_OTHER): Payer: BC Managed Care – PPO | Admitting: Family Medicine

## 2014-03-14 VITALS — BP 112/70 | HR 61 | Temp 98.1°F | Ht 68.0 in | Wt 168.6 lb

## 2014-03-14 DIAGNOSIS — R232 Flushing: Secondary | ICD-10-CM

## 2014-03-14 DIAGNOSIS — N951 Menopausal and female climacteric states: Secondary | ICD-10-CM

## 2014-03-14 DIAGNOSIS — E785 Hyperlipidemia, unspecified: Secondary | ICD-10-CM

## 2014-03-14 DIAGNOSIS — Z0000 Encounter for general adult medical examination without abnormal findings: Secondary | ICD-10-CM

## 2014-03-14 LAB — LIPID PANEL
CHOLESTEROL: 150 mg/dL (ref 0–200)
HDL: 46.1 mg/dL (ref >39.00)
LDL Cholesterol: 87 mg/dL (ref 0–99)
NonHDL: 103.9
Total CHOL/HDL Ratio: 3
Triglycerides: 84 mg/dL (ref 0.0–149.0)
VLDL: 16.8 mg/dL (ref 0.0–40.0)

## 2014-03-14 LAB — CBC WITH DIFFERENTIAL/PLATELET
BASOS PCT: 0.6 % (ref 0.0–3.0)
Basophils Absolute: 0 10*3/uL (ref 0.0–0.1)
Eosinophils Absolute: 0.1 10*3/uL (ref 0.0–0.7)
Eosinophils Relative: 1.7 % (ref 0.0–5.0)
HEMATOCRIT: 42.3 % (ref 36.0–46.0)
HEMOGLOBIN: 14.2 g/dL (ref 12.0–15.0)
LYMPHS ABS: 1.5 10*3/uL (ref 0.7–4.0)
LYMPHS PCT: 34 % (ref 12.0–46.0)
MCHC: 33.6 g/dL (ref 30.0–36.0)
MCV: 91.7 fl (ref 78.0–100.0)
MONOS PCT: 9.7 % (ref 3.0–12.0)
Monocytes Absolute: 0.4 10*3/uL (ref 0.1–1.0)
NEUTROS ABS: 2.4 10*3/uL (ref 1.4–7.7)
Neutrophils Relative %: 54 % (ref 43.0–77.0)
Platelets: 222 10*3/uL (ref 150.0–400.0)
RBC: 4.62 Mil/uL (ref 3.87–5.11)
RDW: 12.5 % (ref 11.5–15.5)
WBC: 4.4 10*3/uL (ref 4.0–10.5)

## 2014-03-14 LAB — POCT URINALYSIS DIPSTICK
Bilirubin, UA: NEGATIVE
Blood, UA: NEGATIVE
KETONES UA: NEGATIVE
Leukocytes, UA: NEGATIVE
NITRITE UA: NEGATIVE
PH UA: 6
Protein, UA: NEGATIVE
Spec Grav, UA: 1.02
Urobilinogen, UA: 0.2

## 2014-03-14 LAB — BASIC METABOLIC PANEL
BUN: 12 mg/dL (ref 6–23)
CALCIUM: 9 mg/dL (ref 8.4–10.5)
CO2: 27 mEq/L (ref 19–32)
Chloride: 102 mEq/L (ref 96–112)
Creatinine, Ser: 1 mg/dL (ref 0.4–1.2)
GFR: 64.03 mL/min (ref >60.00)
GLUCOSE: 88 mg/dL (ref 70–99)
POTASSIUM: 4.5 meq/L (ref 3.5–5.1)
SODIUM: 138 meq/L (ref 135–145)

## 2014-03-14 LAB — HEPATIC FUNCTION PANEL
ALBUMIN: 4.2 g/dL (ref 3.5–5.2)
ALT: 22 U/L (ref 0–35)
AST: 27 U/L (ref 0–37)
Alkaline Phosphatase: 52 U/L (ref 39–117)
Bilirubin, Direct: 0 mg/dL (ref 0.0–0.3)
TOTAL PROTEIN: 7.4 g/dL (ref 6.0–8.3)
Total Bilirubin: 0.5 mg/dL (ref 0.2–1.2)

## 2014-03-14 LAB — TSH: TSH: 0.98 u[IU]/mL (ref 0.35–4.50)

## 2014-03-14 MED ORDER — ESTRADIOL 0.05 MG/24HR TD PTWK: MEDICATED_PATCH | TRANSDERMAL | Status: DC

## 2014-03-14 NOTE — Progress Notes (Signed)
Subjective:     Tanya Nelson is a 53 y.o. female and is here for a comprehensive physical exam. The patient reports no problems.  History   Social History  . Marital Status: Married    Spouse Name: N/A    Number of Children: N/A  . Years of Education: N/A   Occupational History  . [employer]    Social History Main Topics  . Smoking status: Never Smoker   . Smokeless tobacco: Never Used  . Alcohol Use: Yes     Comment: rare  . Drug Use: No  . Sexual Activity:    Partners: Male   Other Topics Concern  . Not on file   Social History Narrative   Exercise-- , walking  2x a week each   Health Maintenance  Topic Date Due  . PAP SMEAR  11/06/2014  . INFLUENZA VACCINE  11/20/2014  . MAMMOGRAM  09/02/2015  . TETANUS/TDAP  04/25/2019  . COLONOSCOPY  06/05/2020    The following portions of the patient's history were reviewed and updated as appropriate:  She  has a past medical history of Heart murmur; Cancer; and Acute lymphadenitis. She  does not have any pertinent problems on file. She  has past surgical history that includes Abdominal hysterectomy and Mohs surgery (08/2008). Her family history includes Hypertension in her mother and another family member; Prostate cancer in her father. She  reports that she has never smoked. She has never used smokeless tobacco. She reports that she drinks alcohol. She reports that she does not use illicit drugs. She has a current medication list which includes the following prescription(s): aspirin ec, estradiol, multivitamin, pravastatin, and qnasl. Current Outpatient Prescriptions on File Prior to Visit  Medication Sig Dispense Refill  . aspirin EC 81 MG EC tablet Take 81 mg by mouth daily.      . Multiple Vitamin (MULTIVITAMIN) tablet Take 1 tablet by mouth daily.      . pravastatin (PRAVACHOL) 40 MG tablet 1 tab by mouth--Repeat labs are due now for more refills 30 tablet 0  . QNASL 80 MCG/ACT AERS PLACE 2 SPRAYS INTO THE NOSE DAILY  8.699999999 g 1   No current facility-administered medications on file prior to visit.   She has No Known Allergies..  Review of Systems Review of Systems  Constitutional: Negative for activity change, appetite change and fatigue.  HENT: Negative for hearing loss, congestion, tinnitus and ear discharge.  dentist q55m Eyes: Negative for visual disturbance (see optho q1y -- vision corrected to 20/20 with glasses).  Respiratory: Negative for cough, chest tightness and shortness of breath.   Cardiovascular: Negative for chest pain, palpitations and leg swelling.  Gastrointestinal: Negative for abdominal pain, diarrhea, constipation and abdominal distention.  Genitourinary: Negative for urgency, frequency, decreased urine volume and difficulty urinating.  Musculoskeletal: Negative for back pain, arthralgias and gait problem.  Skin: Negative for color change, pallor and rash.  Neurological: Negative for dizziness, light-headedness, numbness and headaches.  Hematological: Negative for adenopathy. Does not bruise/bleed easily.  Psychiatric/Behavioral: Negative for suicidal ideas, confusion, sleep disturbance, self-injury, dysphoric mood, decreased concentration and agitation.       Objective:    BP 112/70 mmHg  Pulse 61  Temp(Src) 98.1 F (36.7 C) (Oral)  Ht 5\' 8"  (1.727 m)  Wt 168 lb 9.6 oz (76.476 kg)  BMI 25.64 kg/m2  SpO2 97% General appearance: alert, cooperative, appears stated age and no distress Head: Normocephalic, without obvious abnormality, atraumatic Eyes: conjunctivae/corneas clear. PERRL, EOM's intact.  Fundi benign. Ears: normal TM's and external ear canals both ears Nose: Nares normal. Septum midline. Mucosa normal. No drainage or sinus tenderness. Throat: lips, mucosa, and tongue normal; teeth and gums normal Neck: no adenopathy, no carotid bruit, no JVD, supple, symmetrical, trachea midline and thyroid not enlarged, symmetric, no tenderness/mass/nodules Back:  symmetric, no curvature. ROM normal. No CVA tenderness. Lungs: clear to auscultation bilaterally Breasts: normal appearance, no masses or tenderness Heart: regular rate and rhythm, S1, S2 normal, no murmur, click, rub or gallop Abdomen: soft, non-tender; bowel sounds normal; no masses,  no organomegaly Pelvic: not indicated; status post hysterectomy, negative ROS Extremities: extremities normal, atraumatic, no cyanosis or edema Pulses: 2+ and symmetric Skin: Skin color, texture, turgor normal. No rashes or lesions Lymph nodes: Cervical, supraclavicular, and axillary nodes normal. Neurologic: Alert and oriented X 3, normal strength and tone. Normal symmetric reflexes. Normal coordination and gait psyc- no depression, no anxiety      Assessment:  ghm utd Check labs  Healthy female exam.     1. Preventative health care   - Basic metabolic panel - CBC with Differential - Hepatic function panel - Lipid panel - POCT urinalysis dipstick - TSH  2. Hyperlipidemia Check labs - Hepatic function panel - Lipid panel  3. Hot flashes Refill meds - estradiol (CLIMARA - DOSED IN MG/24 HR) 0.05 mg/24hr patch; APPLY 1 PATCH TO SKIN EVERY WEEK  Dispense: 4 patch; Refill: 1  Plan:     See After Visit Summary for Counseling Recommendations

## 2014-03-14 NOTE — Progress Notes (Signed)
Pre visit review using our clinic review tool, if applicable. No additional management support is needed unless otherwise documented below in the visit note. 

## 2014-03-14 NOTE — Patient Instructions (Signed)
Preventive Care for Adults A healthy lifestyle and preventive care can promote health and wellness. Preventive health guidelines for women include the following key practices.  A routine yearly physical is a good way to check with your health care provider about your health and preventive screening. It is a chance to share any concerns and updates on your health and to receive a thorough exam.  Visit your dentist for a routine exam and preventive care every 6 months. Brush your teeth twice a day and floss once a day. Good oral hygiene prevents tooth decay and gum disease.  The frequency of eye exams is based on your age, health, family medical history, use of contact lenses, and other factors. Follow your health care provider's recommendations for frequency of eye exams.  Eat a healthy diet. Foods like vegetables, fruits, whole grains, low-fat dairy products, and lean protein foods contain the nutrients you need without too many calories. Decrease your intake of foods high in solid fats, added sugars, and salt. Eat the right amount of calories for you.Get information about a proper diet from your health care provider, if necessary.  Regular physical exercise is one of the most important things you can do for your health. Most adults should get at least 150 minutes of moderate-intensity exercise (any activity that increases your heart rate and causes you to sweat) each week. In addition, most adults need muscle-strengthening exercises on 2 or more days a week.  Maintain a healthy weight. The body mass index (BMI) is a screening tool to identify possible weight problems. It provides an estimate of body fat based on height and weight. Your health care provider can find your BMI and can help you achieve or maintain a healthy weight.For adults 20 years and older:  A BMI below 18.5 is considered underweight.  A BMI of 18.5 to 24.9 is normal.  A BMI of 25 to 29.9 is considered overweight.  A BMI of  30 and above is considered obese.  Maintain normal blood lipids and cholesterol levels by exercising and minimizing your intake of saturated fat. Eat a balanced diet with plenty of fruit and vegetables. Blood tests for lipids and cholesterol should begin at age 76 and be repeated every 5 years. If your lipid or cholesterol levels are high, you are over 50, or you are at high risk for heart disease, you may need your cholesterol levels checked more frequently.Ongoing high lipid and cholesterol levels should be treated with medicines if diet and exercise are not working.  If you smoke, find out from your health care provider how to quit. If you do not use tobacco, do not start.  Lung cancer screening is recommended for adults aged 22-80 years who are at high risk for developing lung cancer because of a history of smoking. A yearly low-dose CT scan of the lungs is recommended for people who have at least a 30-pack-year history of smoking and are a current smoker or have quit within the past 15 years. A pack year of smoking is smoking an average of 1 pack of cigarettes a day for 1 year (for example: 1 pack a day for 30 years or 2 packs a day for 15 years). Yearly screening should continue until the smoker has stopped smoking for at least 15 years. Yearly screening should be stopped for people who develop a health problem that would prevent them from having lung cancer treatment.  If you are pregnant, do not drink alcohol. If you are breastfeeding,  be very cautious about drinking alcohol. If you are not pregnant and choose to drink alcohol, do not have more than 1 drink per day. One drink is considered to be 12 ounces (355 mL) of beer, 5 ounces (148 mL) of wine, or 1.5 ounces (44 mL) of liquor.  Avoid use of street drugs. Do not share needles with anyone. Ask for help if you need support or instructions about stopping the use of drugs.  High blood pressure causes heart disease and increases the risk of  stroke. Your blood pressure should be checked at least every 1 to 2 years. Ongoing high blood pressure should be treated with medicines if weight loss and exercise do not work.  If you are 3-86 years old, ask your health care provider if you should take aspirin to prevent strokes.  Diabetes screening involves taking a blood sample to check your fasting blood sugar level. This should be done once every 3 years, after age 67, if you are within normal weight and without risk factors for diabetes. Testing should be considered at a younger age or be carried out more frequently if you are overweight and have at least 1 risk factor for diabetes.  Breast cancer screening is essential preventive care for women. You should practice "breast self-awareness." This means understanding the normal appearance and feel of your breasts and may include breast self-examination. Any changes detected, no matter how small, should be reported to a health care provider. Women in their 8s and 30s should have a clinical breast exam (CBE) by a health care provider as part of a regular health exam every 1 to 3 years. After age 70, women should have a CBE every year. Starting at age 25, women should consider having a mammogram (breast X-ray test) every year. Women who have a family history of breast cancer should talk to their health care provider about genetic screening. Women at a high risk of breast cancer should talk to their health care providers about having an MRI and a mammogram every year.  Breast cancer gene (BRCA)-related cancer risk assessment is recommended for women who have family members with BRCA-related cancers. BRCA-related cancers include breast, ovarian, tubal, and peritoneal cancers. Having family members with these cancers may be associated with an increased risk for harmful changes (mutations) in the breast cancer genes BRCA1 and BRCA2. Results of the assessment will determine the need for genetic counseling and  BRCA1 and BRCA2 testing.  Routine pelvic exams to screen for cancer are no longer recommended for nonpregnant women who are considered low risk for cancer of the pelvic organs (ovaries, uterus, and vagina) and who do not have symptoms. Ask your health care provider if a screening pelvic exam is right for you.  If you have had past treatment for cervical cancer or a condition that could lead to cancer, you need Pap tests and screening for cancer for at least 20 years after your treatment. If Pap tests have been discontinued, your risk factors (such as having a new sexual partner) need to be reassessed to determine if screening should be resumed. Some women have medical problems that increase the chance of getting cervical cancer. In these cases, your health care provider may recommend more frequent screening and Pap tests.  The HPV test is an additional test that may be used for cervical cancer screening. The HPV test looks for the virus that can cause the cell changes on the cervix. The cells collected during the Pap test can be  tested for HPV. The HPV test could be used to screen women aged 30 years and older, and should be used in women of any age who have unclear Pap test results. After the age of 30, women should have HPV testing at the same frequency as a Pap test.  Colorectal cancer can be detected and often prevented. Most routine colorectal cancer screening begins at the age of 50 years and continues through age 75 years. However, your health care provider may recommend screening at an earlier age if you have risk factors for colon cancer. On a yearly basis, your health care provider may provide home test kits to check for hidden blood in the stool. Use of a small camera at the end of a tube, to directly examine the colon (sigmoidoscopy or colonoscopy), can detect the earliest forms of colorectal cancer. Talk to your health care provider about this at age 50, when routine screening begins. Direct  exam of the colon should be repeated every 5-10 years through age 75 years, unless early forms of pre-cancerous polyps or small growths are found.  People who are at an increased risk for hepatitis B should be screened for this virus. You are considered at high risk for hepatitis B if:  You were born in a country where hepatitis B occurs often. Talk with your health care provider about which countries are considered high risk.  Your parents were born in a high-risk country and you have not received a shot to protect against hepatitis B (hepatitis B vaccine).  You have HIV or AIDS.  You use needles to inject street drugs.  You live with, or have sex with, someone who has hepatitis B.  You get hemodialysis treatment.  You take certain medicines for conditions like cancer, organ transplantation, and autoimmune conditions.  Hepatitis C blood testing is recommended for all people born from 1945 through 1965 and any individual with known risks for hepatitis C.  Practice safe sex. Use condoms and avoid high-risk sexual practices to reduce the spread of sexually transmitted infections (STIs). STIs include gonorrhea, chlamydia, syphilis, trichomonas, herpes, HPV, and human immunodeficiency virus (HIV). Herpes, HIV, and HPV are viral illnesses that have no cure. They can result in disability, cancer, and death.  You should be screened for sexually transmitted illnesses (STIs) including gonorrhea and chlamydia if:  You are sexually active and are younger than 24 years.  You are older than 24 years and your health care provider tells you that you are at risk for this type of infection.  Your sexual activity has changed since you were last screened and you are at an increased risk for chlamydia or gonorrhea. Ask your health care provider if you are at risk.  If you are at risk of being infected with HIV, it is recommended that you take a prescription medicine daily to prevent HIV infection. This is  called preexposure prophylaxis (PrEP). You are considered at risk if:  You are a heterosexual woman, are sexually active, and are at increased risk for HIV infection.  You take drugs by injection.  You are sexually active with a partner who has HIV.  Talk with your health care provider about whether you are at high risk of being infected with HIV. If you choose to begin PrEP, you should first be tested for HIV. You should then be tested every 3 months for as long as you are taking PrEP.  Osteoporosis is a disease in which the bones lose minerals and strength   with aging. This can result in serious bone fractures or breaks. The risk of osteoporosis can be identified using a bone density scan. Women ages 65 years and over and women at risk for fractures or osteoporosis should discuss screening with their health care providers. Ask your health care provider whether you should take a calcium supplement or vitamin D to reduce the rate of osteoporosis.  Menopause can be associated with physical symptoms and risks. Hormone replacement therapy is available to decrease symptoms and risks. You should talk to your health care provider about whether hormone replacement therapy is right for you.  Use sunscreen. Apply sunscreen liberally and repeatedly throughout the day. You should seek shade when your shadow is shorter than you. Protect yourself by wearing long sleeves, pants, a wide-brimmed hat, and sunglasses year round, whenever you are outdoors.  Once a month, do a whole body skin exam, using a mirror to look at the skin on your back. Tell your health care provider of new moles, moles that have irregular borders, moles that are larger than a pencil eraser, or moles that have changed in shape or color.  Stay current with required vaccines (immunizations).  Influenza vaccine. All adults should be immunized every year.  Tetanus, diphtheria, and acellular pertussis (Td, Tdap) vaccine. Pregnant women should  receive 1 dose of Tdap vaccine during each pregnancy. The dose should be obtained regardless of the length of time since the last dose. Immunization is preferred during the 27th-36th week of gestation. An adult who has not previously received Tdap or who does not know her vaccine status should receive 1 dose of Tdap. This initial dose should be followed by tetanus and diphtheria toxoids (Td) booster doses every 10 years. Adults with an unknown or incomplete history of completing a 3-dose immunization series with Td-containing vaccines should begin or complete a primary immunization series including a Tdap dose. Adults should receive a Td booster every 10 years.  Varicella vaccine. An adult without evidence of immunity to varicella should receive 2 doses or a second dose if she has previously received 1 dose. Pregnant females who do not have evidence of immunity should receive the first dose after pregnancy. This first dose should be obtained before leaving the health care facility. The second dose should be obtained 4-8 weeks after the first dose.  Human papillomavirus (HPV) vaccine. Females aged 13-26 years who have not received the vaccine previously should obtain the 3-dose series. The vaccine is not recommended for use in pregnant females. However, pregnancy testing is not needed before receiving a dose. If a female is found to be pregnant after receiving a dose, no treatment is needed. In that case, the remaining doses should be delayed until after the pregnancy. Immunization is recommended for any person with an immunocompromised condition through the age of 26 years if she did not get any or all doses earlier. During the 3-dose series, the second dose should be obtained 4-8 weeks after the first dose. The third dose should be obtained 24 weeks after the first dose and 16 weeks after the second dose.  Zoster vaccine. One dose is recommended for adults aged 60 years or older unless certain conditions are  present.  Measles, mumps, and rubella (MMR) vaccine. Adults born before 1957 generally are considered immune to measles and mumps. Adults born in 1957 or later should have 1 or more doses of MMR vaccine unless there is a contraindication to the vaccine or there is laboratory evidence of immunity to   each of the three diseases. A routine second dose of MMR vaccine should be obtained at least 28 days after the first dose for students attending postsecondary schools, health care workers, or international travelers. People who received inactivated measles vaccine or an unknown type of measles vaccine during 1963-1967 should receive 2 doses of MMR vaccine. People who received inactivated mumps vaccine or an unknown type of mumps vaccine before 1979 and are at high risk for mumps infection should consider immunization with 2 doses of MMR vaccine. For females of childbearing age, rubella immunity should be determined. If there is no evidence of immunity, females who are not pregnant should be vaccinated. If there is no evidence of immunity, females who are pregnant should delay immunization until after pregnancy. Unvaccinated health care workers born before 1957 who lack laboratory evidence of measles, mumps, or rubella immunity or laboratory confirmation of disease should consider measles and mumps immunization with 2 doses of MMR vaccine or rubella immunization with 1 dose of MMR vaccine.  Pneumococcal 13-valent conjugate (PCV13) vaccine. When indicated, a person who is uncertain of her immunization history and has no record of immunization should receive the PCV13 vaccine. An adult aged 19 years or older who has certain medical conditions and has not been previously immunized should receive 1 dose of PCV13 vaccine. This PCV13 should be followed with a dose of pneumococcal polysaccharide (PPSV23) vaccine. The PPSV23 vaccine dose should be obtained at least 8 weeks after the dose of PCV13 vaccine. An adult aged 19  years or older who has certain medical conditions and previously received 1 or more doses of PPSV23 vaccine should receive 1 dose of PCV13. The PCV13 vaccine dose should be obtained 1 or more years after the last PPSV23 vaccine dose.  Pneumococcal polysaccharide (PPSV23) vaccine. When PCV13 is also indicated, PCV13 should be obtained first. All adults aged 65 years and older should be immunized. An adult younger than age 65 years who has certain medical conditions should be immunized. Any person who resides in a nursing home or long-term care facility should be immunized. An adult smoker should be immunized. People with an immunocompromised condition and certain other conditions should receive both PCV13 and PPSV23 vaccines. People with human immunodeficiency virus (HIV) infection should be immunized as soon as possible after diagnosis. Immunization during chemotherapy or radiation therapy should be avoided. Routine use of PPSV23 vaccine is not recommended for American Indians, [place] Natives, or people younger than 65 years unless there are medical conditions that require PPSV23 vaccine. When indicated, people who have unknown immunization and have no record of immunization should receive PPSV23 vaccine. One-time revaccination 5 years after the first dose of PPSV23 is recommended for people aged 19-64 years who have chronic kidney failure, nephrotic syndrome, asplenia, or immunocompromised conditions. People who received 1-2 doses of PPSV23 before age 65 years should receive another dose of PPSV23 vaccine at age 65 years or later if at least 5 years have passed since the previous dose. Doses of PPSV23 are not needed for people immunized with PPSV23 at or after age 65 years.  Meningococcal vaccine. Adults with asplenia or persistent complement component deficiencies should receive 2 doses of quadrivalent meningococcal conjugate (MenACWY-D) vaccine. The doses should be obtained at least 2 months apart.  Microbiologists working with certain meningococcal bacteria, military recruits, people at risk during an outbreak, and people who travel to or live in countries with a high rate of meningitis should be immunized. A first-year college student up through age   21 years who is living in a residence hall should receive a dose if she did not receive a dose on or after her 16th birthday. Adults who have certain high-risk conditions should receive one or more doses of vaccine.  Hepatitis A vaccine. Adults who wish to be protected from this disease, have certain high-risk conditions, work with hepatitis A-infected animals, work in hepatitis A research labs, or travel to or work in countries with a high rate of hepatitis A should be immunized. Adults who were previously unvaccinated and who anticipate close contact with an international adoptee during the first 60 days after arrival in the [place] from a country with a high rate of hepatitis A should be immunized.  Hepatitis B vaccine. Adults who wish to be protected from this disease, have certain high-risk conditions, may be exposed to blood or other infectious body fluids, are household contacts or sex partners of hepatitis B positive people, are clients or workers in certain care facilities, or travel to or work in countries with a high rate of hepatitis B should be immunized.  Haemophilus influenzae type b (Hib) vaccine. A previously unvaccinated person with asplenia or sickle cell disease or having a scheduled splenectomy should receive 1 dose of Hib vaccine. Regardless of previous immunization, a recipient of a hematopoietic stem cell transplant should receive a 3-dose series 6-12 months after her successful transplant. Hib vaccine is not recommended for adults with HIV infection. Preventive Services / Frequency Ages 64 to 68 years  Blood pressure check.** / Every 1 to 2 years.  Lipid and cholesterol check.** / Every 5 years beginning at age  22.  Clinical breast exam.** / Every 3 years for women in their 88s and 53s.  BRCA-related cancer risk assessment.** / For women who have family members with a BRCA-related cancer (breast, ovarian, tubal, or peritoneal cancers).  Pap test.** / Every 2 years from ages 90 through 51. Every 3 years starting at age 21 through age 56 or 3 with a history of 3 consecutive normal Pap tests.  HPV screening.** / Every 3 years from ages 24 through ages 1 to 46 with a history of 3 consecutive normal Pap tests.  Hepatitis C blood test.** / For any individual with known risks for hepatitis C.  Skin self-exam. / Monthly.  Influenza vaccine. / Every year.  Tetanus, diphtheria, and acellular pertussis (Tdap, Td) vaccine.** / Consult your health care provider. Pregnant women should receive 1 dose of Tdap vaccine during each pregnancy. 1 dose of Td every 10 years.  Varicella vaccine.** / Consult your health care provider. Pregnant females who do not have evidence of immunity should receive the first dose after pregnancy.  HPV vaccine. / 3 doses over 6 months, if 72 and younger. The vaccine is not recommended for use in pregnant females. However, pregnancy testing is not needed before receiving a dose.  Measles, mumps, rubella (MMR) vaccine.** / You need at least 1 dose of MMR if you were born in 1957 or later. You may also need a 2nd dose. For females of childbearing age, rubella immunity should be determined. If there is no evidence of immunity, females who are not pregnant should be vaccinated. If there is no evidence of immunity, females who are pregnant should delay immunization until after pregnancy.  Pneumococcal 13-valent conjugate (PCV13) vaccine.** / Consult your health care provider.  Pneumococcal polysaccharide (PPSV23) vaccine.** / 1 to 2 doses if you smoke cigarettes or if you have certain conditions.  Meningococcal vaccine.** /  1 dose if you are age 19 to 21 years and a first-year college  student living in a residence hall, or have one of several medical conditions, you need to get vaccinated against meningococcal disease. You may also need additional booster doses.  Hepatitis A vaccine.** / Consult your health care provider.  Hepatitis B vaccine.** / Consult your health care provider.  Haemophilus influenzae type b (Hib) vaccine.** / Consult your health care provider. Ages 40 to 64 years  Blood pressure check.** / Every 1 to 2 years.  Lipid and cholesterol check.** / Every 5 years beginning at age 20 years.  Lung cancer screening. / Every year if you are aged 55-80 years and have a 30-pack-year history of smoking and currently smoke or have quit within the past 15 years. Yearly screening is stopped once you have quit smoking for at least 15 years or develop a health problem that would prevent you from having lung cancer treatment.  Clinical breast exam.** / Every year after age 40 years.  BRCA-related cancer risk assessment.** / For women who have family members with a BRCA-related cancer (breast, ovarian, tubal, or peritoneal cancers).  Mammogram.** / Every year beginning at age 40 years and continuing for as long as you are in good health. Consult with your health care provider.  Pap test.** / Every 3 years starting at age 30 years through age 65 or 70 years with a history of 3 consecutive normal Pap tests.  HPV screening.** / Every 3 years from ages 30 years through ages 65 to 70 years with a history of 3 consecutive normal Pap tests.  Fecal occult blood test (FOBT) of stool. / Every year beginning at age 50 years and continuing until age 75 years. You may not need to do this test if you get a colonoscopy every 10 years.  Flexible sigmoidoscopy or colonoscopy.** / Every 5 years for a flexible sigmoidoscopy or every 10 years for a colonoscopy beginning at age 50 years and continuing until age 75 years.  Hepatitis C blood test.** / For all people born from 1945 through  1965 and any individual with known risks for hepatitis C.  Skin self-exam. / Monthly.  Influenza vaccine. / Every year.  Tetanus, diphtheria, and acellular pertussis (Tdap/Td) vaccine.** / Consult your health care provider. Pregnant women should receive 1 dose of Tdap vaccine during each pregnancy. 1 dose of Td every 10 years.  Varicella vaccine.** / Consult your health care provider. Pregnant females who do not have evidence of immunity should receive the first dose after pregnancy.  Zoster vaccine.** / 1 dose for adults aged 60 years or older.  Measles, mumps, rubella (MMR) vaccine.** / You need at least 1 dose of MMR if you were born in 1957 or later. You may also need a 2nd dose. For females of childbearing age, rubella immunity should be determined. If there is no evidence of immunity, females who are not pregnant should be vaccinated. If there is no evidence of immunity, females who are pregnant should delay immunization until after pregnancy.  Pneumococcal 13-valent conjugate (PCV13) vaccine.** / Consult your health care provider.  Pneumococcal polysaccharide (PPSV23) vaccine.** / 1 to 2 doses if you smoke cigarettes or if you have certain conditions.  Meningococcal vaccine.** / Consult your health care provider.  Hepatitis A vaccine.** / Consult your health care provider.  Hepatitis B vaccine.** / Consult your health care provider.  Haemophilus influenzae type b (Hib) vaccine.** / Consult your health care provider. Ages 65   years and over  Blood pressure check.** / Every 1 to 2 years.  Lipid and cholesterol check.** / Every 5 years beginning at age 22 years.  Lung cancer screening. / Every year if you are aged 73-80 years and have a 30-pack-year history of smoking and currently smoke or have quit within the past 15 years. Yearly screening is stopped once you have quit smoking for at least 15 years or develop a health problem that would prevent you from having lung cancer  treatment.  Clinical breast exam.** / Every year after age 4 years.  BRCA-related cancer risk assessment.** / For women who have family members with a BRCA-related cancer (breast, ovarian, tubal, or peritoneal cancers).  Mammogram.** / Every year beginning at age 40 years and continuing for as long as you are in good health. Consult with your health care provider.  Pap test.** / Every 3 years starting at age 9 years through age 34 or 91 years with 3 consecutive normal Pap tests. Testing can be stopped between 65 and 70 years with 3 consecutive normal Pap tests and no abnormal Pap or HPV tests in the past 10 years.  HPV screening.** / Every 3 years from ages 57 years through ages 64 or 45 years with a history of 3 consecutive normal Pap tests. Testing can be stopped between 65 and 70 years with 3 consecutive normal Pap tests and no abnormal Pap or HPV tests in the past 10 years.  Fecal occult blood test (FOBT) of stool. / Every year beginning at age 15 years and continuing until age 17 years. You may not need to do this test if you get a colonoscopy every 10 years.  Flexible sigmoidoscopy or colonoscopy.** / Every 5 years for a flexible sigmoidoscopy or every 10 years for a colonoscopy beginning at age 86 years and continuing until age 71 years.  Hepatitis C blood test.** / For all people born from [birth year] through 1967 and any individual with known risks for hepatitis C.  Osteoporosis screening.** / A one-time screening for women ages 83 years and over and women at risk for fractures or osteoporosis.  Skin self-exam. / Monthly.  Influenza vaccine. / Every year.  Tetanus, diphtheria, and acellular pertussis (Tdap/Td) vaccine.** / 1 dose of Td every 10 years.  Varicella vaccine.** / Consult your health care provider.  Zoster vaccine.** / 1 dose for adults aged 61 years or older.  Pneumococcal 13-valent conjugate (PCV13) vaccine.** / Consult your health care provider.  Pneumococcal  polysaccharide (PPSV23) vaccine.** / 1 dose for all adults aged 28 years and older.  Meningococcal vaccine.** / Consult your health care provider.  Hepatitis A vaccine.** / Consult your health care provider.  Hepatitis B vaccine.** / Consult your health care provider.  Haemophilus influenzae type b (Hib) vaccine.** / Consult your health care provider. ** Family history and personal history of risk and conditions may change your health care provider's recommendations. Document Released: 06/03/2001 Document Revised: 08/22/2013 Document Reviewed: 09/02/2010 [facility] Patient Information 2015 [place], [place]. This information is not intended to replace advice given to you by your health care provider. Make sure you discuss any questions you have with your health care provider.

## 2014-03-19 ENCOUNTER — Other Ambulatory Visit: Payer: Self-pay | Admitting: Family Medicine

## 2014-05-04 ENCOUNTER — Other Ambulatory Visit: Payer: Self-pay | Admitting: Family Medicine

## 2014-05-17 ENCOUNTER — Other Ambulatory Visit: Payer: Self-pay | Admitting: Family Medicine

## 2014-09-12 ENCOUNTER — Other Ambulatory Visit: Payer: Self-pay | Admitting: Family Medicine

## 2014-09-13 NOTE — Telephone Encounter (Signed)
30 day supply of pravastatin sent to the pharmacy. Pt is due now for fasting 6 month f/u with Dr Etter Sjogren. Please call pt to arrange appt. Thanks!

## 2014-09-14 NOTE — Telephone Encounter (Signed)
Pt scheduled 6 month follow w/ labs 10/13/2014

## 2014-09-22 ENCOUNTER — Other Ambulatory Visit: Payer: Self-pay

## 2014-09-22 DIAGNOSIS — Z1231 Encounter for screening mammogram for malignant neoplasm of breast: Secondary | ICD-10-CM

## 2014-10-05 ENCOUNTER — Encounter: Payer: Self-pay | Admitting: Gastroenterology

## 2014-10-09 ENCOUNTER — Other Ambulatory Visit: Payer: Self-pay | Admitting: Family Medicine

## 2014-10-09 ENCOUNTER — Ambulatory Visit
Admission: RE | Admit: 2014-10-09 | Discharge: 2014-10-09 | Disposition: A | Discharge status: Home / Self Care | Payer: BLUE CROSS/BLUE SHIELD | Source: Ambulatory Visit

## 2014-10-09 DIAGNOSIS — R928 Other abnormal and inconclusive findings on diagnostic imaging of breast: Secondary | ICD-10-CM

## 2014-10-09 DIAGNOSIS — Z1231 Encounter for screening mammogram for malignant neoplasm of breast: Secondary | ICD-10-CM

## 2014-10-12 ENCOUNTER — Ambulatory Visit
Admission: RE | Admit: 2014-10-12 | Discharge: 2014-10-12 | Disposition: A | Discharge status: Home / Self Care | Payer: BLUE CROSS/BLUE SHIELD | Source: Ambulatory Visit | Attending: Family Medicine | Admitting: Family Medicine

## 2014-10-12 DIAGNOSIS — R928 Other abnormal and inconclusive findings on diagnostic imaging of breast: Secondary | ICD-10-CM

## 2014-10-13 ENCOUNTER — Ambulatory Visit (INDEPENDENT_AMBULATORY_CARE_PROVIDER_SITE_OTHER): Payer: BLUE CROSS/BLUE SHIELD | Admitting: Family Medicine

## 2014-10-13 ENCOUNTER — Encounter: Payer: Self-pay | Admitting: Family Medicine

## 2014-10-13 VITALS — BP 110/60 | HR 57 | Temp 98.3°F | Ht 68.0 in | Wt 156.8 lb

## 2014-10-13 DIAGNOSIS — E785 Hyperlipidemia, unspecified: Secondary | ICD-10-CM

## 2014-10-13 LAB — HEPATIC FUNCTION PANEL
ALBUMIN: 4.1 g/dL (ref 3.5–5.2)
ALT: 16 U/L (ref 0–35)
AST: 20 U/L (ref 0–37)
Alkaline Phosphatase: 49 U/L (ref 39–117)
BILIRUBIN TOTAL: 0.6 mg/dL (ref 0.2–1.2)
Bilirubin, Direct: 0.2 mg/dL (ref 0.0–0.3)
Total Protein: 7.4 g/dL (ref 6.0–8.3)

## 2014-10-13 LAB — LIPID PANEL
CHOLESTEROL: 138 mg/dL (ref 0–200)
HDL: 44.5 mg/dL (ref >39.00)
LDL CALC: 83 mg/dL (ref 0–99)
NonHDL: 93.5
Total CHOL/HDL Ratio: 3
Triglycerides: 54 mg/dL (ref 0.0–149.0)
VLDL: 10.8 mg/dL (ref 0.0–40.0)

## 2014-10-13 NOTE — Assessment & Plan Note (Addendum)
Check labs con't meds--pravachol

## 2014-10-13 NOTE — Progress Notes (Signed)
Patient ID: Tanya Nelson, female    DOB: [birth year]  Age: 54 y.o. MRN: [id number]    Subjective:  Subjective HPI Tanya Nelson presents for f/u cholesterol.   Review of Systems  Constitutional: Negative for activity change, appetite change, fatigue and unexpected weight change.  Respiratory: Negative for cough and shortness of breath.   Cardiovascular: Negative for chest pain and palpitations.  Psychiatric/Behavioral: Negative for behavioral problems and dysphoric mood. The patient is not nervous/anxious.     History Past Medical History  Diagnosis Date  . Heart murmur   . Cancer     Basal Cell--Face  . Acute lymphadenitis     She has past surgical history that includes Abdominal hysterectomy and Mohs surgery (08/2008).   Her family history includes Hypertension in her mother and another family member; Prostate cancer in her father.She reports that she has never smoked. She has never used smokeless tobacco. She reports that she drinks alcohol. She reports that she does not use illicit drugs.  Current Outpatient Prescriptions on File Prior to Visit  Medication Sig Dispense Refill  . aspirin EC 81 MG EC tablet Take 81 mg by mouth daily.      Marland Kitchen estradiol (CLIMARA - DOSED IN MG/24 HR) 0.05 mg/24hr patch APPLY 1 PATCH TO SKIN EVERY WEEK 4 patch 5  . Multiple Vitamin (MULTIVITAMIN) tablet Take 1 tablet by mouth daily.      . pravastatin (PRAVACHOL) 40 MG tablet TAKE 1 TABLET BY MOUTH EVERY DAY 30 tablet 0  . QNASL 80 MCG/ACT AERS INSTILL 2 SPRAYS IN EACH NOSTRIL EVERY DAY 8.7 g 1   No current facility-administered medications on file prior to visit.     Objective:  Objective Physical Exam  Constitutional: She is oriented to person, place, and time. She appears well-developed and well-nourished.  HENT:  Head: Normocephalic and atraumatic.  Eyes: Conjunctivae and EOM are normal.  Neck: Normal range of motion. Neck supple. No JVD present. Carotid bruit is not present. No  thyromegaly present.  Cardiovascular: Normal rate, regular rhythm and normal heart sounds.   No murmur heard. Pulmonary/Chest: Effort normal and breath sounds normal. No respiratory distress. She has no wheezes. She has no rales. She exhibits no tenderness.  Musculoskeletal: She exhibits no edema.  Neurological: She is alert and oriented to person, place, and time.  Psychiatric: She has a normal mood and affect. Her behavior is normal.   BP 110/60 mmHg  Pulse 57  Temp(Src) 98.3 F (36.8 C) (Oral)  Ht 5\' 8"  (1.727 m)  Wt 156 lb 12.8 oz (71.124 kg)  BMI 23.85 kg/m2  SpO2 98% Wt Readings from Last 3 Encounters:  10/13/14 156 lb 12.8 oz (71.124 kg)  03/14/14 168 lb 9.6 oz (76.476 kg)  12/31/12 167 lb 6.4 oz (75.932 kg)     Lab Results  Component Value Date   WBC 4.4 03/14/2014   HGB 14.2 03/14/2014   HCT 42.3 03/14/2014   PLT 222.0 03/14/2014   GLUCOSE 88 03/14/2014   CHOL 150 03/14/2014   TRIG 84.0 03/14/2014   HDL 46.10 03/14/2014   LDLDIRECT 158.7 03/31/2008   LDLCALC 87 03/14/2014   ALT 22 03/14/2014   AST 27 03/14/2014   NA 138 03/14/2014   K 4.5 03/14/2014   CL 102 03/14/2014   CREATININE 1.0 03/14/2014   BUN 12 03/14/2014   CO2 27 03/14/2014   TSH 0.98 03/14/2014    Mm Diag Breast Tomo Uni Right  10/12/2014   CLINICAL DATA:  Possible distortion in the upper outer right breast at recent screening mammography.  EXAM: DIGITAL DIAGNOSTIC RIGHT MAMMOGRAM WITH 3D TOMOSYNTHESIS AND CAD  COMPARISON:  Previous examinations, including the screening mammogram dated 10/09/2014.  ACR Breast Density Category c: The breast tissue is heterogeneously dense, which may obscure small masses.  FINDINGS: 3D tomographic images of the right breast demonstrate normal appearing fibroglandular tissue at the location of the recently suspected distortion in the upper-outer quadrant.  Mammographic images were processed with CAD.  IMPRESSION: No evidence of malignancy. The recently suspected right  breast distortion represented overlapping of normal fibroglandular tissue.  RECOMMENDATION: Bilateral screening mammogram in 1 year.  I have discussed the findings and recommendations with the patient. Results were also provided in writing at the conclusion of the visit. If applicable, a reminder letter will be sent to the patient regarding the next appointment.  BI-RADS CATEGORY  1: Negative.   Electronically Signed   By: Claudie Revering M.D.   On: 10/12/2014 08:52     Assessment & Plan:  Plan I am having Ms. Hagarty maintain her aspirin EC, multivitamin, QNASL, estradiol, and pravastatin.  No orders of the defined types were placed in this encounter.    Problem List Items Addressed This Visit    Hyperlipidemia - Primary    Check labs con't meds--pravachol      Relevant Orders   Lipid panel   Hepatic function panel      Follow-up: Return in about 6 months (around 04/14/2015), or if symptoms worsen or fail to improve, for annual exam, fasting.  Garnet Koyanagi, DO

## 2014-10-13 NOTE — Progress Notes (Signed)
Pre visit review using our clinic review tool, if applicable. No additional management support is needed unless otherwise documented below in the visit note. 

## 2014-10-13 NOTE — Patient Instructions (Signed)

## 2014-10-16 ENCOUNTER — Other Ambulatory Visit: Payer: Self-pay | Admitting: Family Medicine

## 2014-11-05 ENCOUNTER — Other Ambulatory Visit: Payer: Self-pay | Admitting: Family Medicine

## 2015-04-02 ENCOUNTER — Other Ambulatory Visit: Payer: Self-pay | Admitting: Family Medicine

## 2015-04-26 ENCOUNTER — Other Ambulatory Visit: Payer: Self-pay | Admitting: Family Medicine

## 2015-05-14 ENCOUNTER — Other Ambulatory Visit: Payer: Self-pay | Admitting: Family Medicine

## 2015-05-14 MED ORDER — ESTRADIOL 0.05 MG/24HR TD PTWK: MEDICATED_PATCH | TRANSDERMAL | Status: DC

## 2015-05-14 NOTE — Telephone Encounter (Signed)
Relation to [id number] Call back [place]: [id number] - [place], [place] AT [facility] [address] [phone] (Phone) [phone] (Fax)         Reason for call:  Patient in addition is asking for a refill estradiol (CLIMARA - DOSED IN MG/24 HR) 0.05 mg/24hr patch and pravastatin (PRAVACHOL) 40 MG tablet to hold her over until physical appointment which is scheduled for 09/03/2015

## 2015-05-14 NOTE — Telephone Encounter (Signed)
Med's faxed.    KP 

## 2015-08-11 ENCOUNTER — Other Ambulatory Visit: Payer: Self-pay | Admitting: Family Medicine

## 2015-08-31 ENCOUNTER — Encounter: Payer: Self-pay | Admitting: Behavioral Health

## 2015-08-31 ENCOUNTER — Telehealth: Payer: Self-pay | Admitting: Behavioral Health

## 2015-08-31 NOTE — Telephone Encounter (Signed)
Unable to reach patient at time of Pre-Visit Call.  Left message for patient to return call when available.    

## 2015-08-31 NOTE — Addendum Note (Signed)
Addended by: Kathlen Brunswick on: 08/31/2015 02:43 PM   Modules accepted: Medications

## 2015-09-03 ENCOUNTER — Encounter: Payer: Self-pay | Admitting: Family Medicine

## 2015-09-03 ENCOUNTER — Ambulatory Visit (INDEPENDENT_AMBULATORY_CARE_PROVIDER_SITE_OTHER): Payer: BLUE CROSS/BLUE SHIELD | Admitting: Family Medicine

## 2015-09-03 VITALS — BP 124/60 | HR 64 | Temp 98.5°F | Ht 68.0 in | Wt 152.0 lb

## 2015-09-03 DIAGNOSIS — E785 Hyperlipidemia, unspecified: Secondary | ICD-10-CM

## 2015-09-03 DIAGNOSIS — Z1159 Encounter for screening for other viral diseases: Secondary | ICD-10-CM

## 2015-09-03 DIAGNOSIS — Z114 Encounter for screening for human immunodeficiency virus [HIV]: Secondary | ICD-10-CM

## 2015-09-03 DIAGNOSIS — Z0000 Encounter for general adult medical examination without abnormal findings: Secondary | ICD-10-CM | POA: Diagnosis not present

## 2015-09-03 LAB — POCT URINALYSIS DIPSTICK
BILIRUBIN UA: NEGATIVE
Glucose, UA: NEGATIVE
KETONES UA: NEGATIVE
Leukocytes, UA: NEGATIVE
Nitrite, UA: NEGATIVE
PH UA: 6
Protein, UA: NEGATIVE
RBC UA: NEGATIVE
Spec Grav, UA: 1.015
Urobilinogen, UA: 0.2

## 2015-09-03 MED ORDER — PRAVASTATIN SODIUM 40 MG PO TABS: ORAL_TABLET | ORAL | Status: DC

## 2015-09-03 NOTE — Progress Notes (Signed)
------ Subjective:     Tanya Nelson is a 55 y.o. female and is here for a comprehensive physical exam. The patient reports no problems.  Social History   Social History  . Marital Status: Married    Spouse Name: N/A  . Number of Children: N/A  . Years of Education: N/A   Occupational History  . [employer]    Social History Main Topics  . Smoking status: Never Smoker   . Smokeless tobacco: Never Used  . Alcohol Use: Yes     Comment: rare  . Drug Use: No  . Sexual Activity:    Partners: Male   Other Topics Concern  . Not on file   Social History Narrative   Exercise-no   Health Maintenance  Topic Date Due  . Hepatitis C Screening  06/29/1960  . PAP SMEAR  11/06/2014  . HIV Screening  10/13/2015 (Originally 09/28/1973)  . INFLUENZA VACCINE  11/20/2015  . MAMMOGRAM  10/08/2016  . TETANUS/TDAP  04/25/2019  . COLONOSCOPY  06/07/2020    The following portions of the patient's history were reviewed and updated as appropriate:  She  has a past medical history of Heart murmur; Cancer ([place]); and Acute lymphadenitis. She  does not have any pertinent problems on file. She  has past surgical history that includes Abdominal hysterectomy and Mohs surgery (08/2008). Her family history includes Aneurysm in her mother; Hypertension in her mother; Prostate cancer in her father. She  reports that she has never smoked. She has never used smokeless tobacco. She reports that she drinks alcohol. She reports that she does not use illicit drugs. She has a current medication list which includes the following prescription(s): aspirin ec, estradiol, multivitamin, pravastatin, and qnasl. Current Outpatient Prescriptions on File Prior to Visit  Medication Sig Dispense Refill  . aspirin EC 81 MG EC tablet Take 81 mg by mouth daily.      Marland Kitchen estradiol (CLIMARA - DOSED IN MG/24 HR) 0.05 mg/24hr patch APPLY 1 PATCH EXTERNALLY TO THE SKIN EVERY WEEK 4 patch 1  . Multiple Vitamin (MULTIVITAMIN) tablet  Take 1 tablet by mouth daily.      . pravastatin (PRAVACHOL) 40 MG tablet TAKE 1 TABLET(40 MG) BY MOUTH DAILY. REPEAT LABS ARE DUE NOW 30 tablet 4  . QNASL 80 MCG/ACT AERS INSTILL 2 SPRAYS IN EACH NOSTRIL EVERY DAY 8.7 g 1   No current facility-administered medications on file prior to visit.   She has No Known Allergies..  Review of Systems Review of Systems  Constitutional: Negative for activity change, appetite change and fatigue.  HENT: Negative for hearing loss, congestion, tinnitus and ear discharge.  dentist q95m Eyes: Negative for visual disturbance (see optho q1y -- vision corrected to 20/20 with glasses).  Respiratory: Negative for cough, chest tightness and shortness of breath.   Cardiovascular: Negative for chest pain, palpitations and leg swelling.  Gastrointestinal: Negative for abdominal pain, diarrhea, constipation and abdominal distention.  Genitourinary: Negative for urgency, frequency, decreased urine volume and difficulty urinating.  Musculoskeletal: Negative for back pain, arthralgias and gait problem.  Skin: Negative for color change, pallor and rash.  Neurological: Negative for dizziness, light-headedness, numbness and headaches.  Hematological: Negative for adenopathy. Does not bruise/bleed easily.  Psychiatric/Behavioral: Negative for suicidal ideas, confusion, sleep disturbance, self-injury, dysphoric mood, decreased concentration and agitation.       Objective:    BP 124/60 mmHg  Pulse 64  Temp(Src) 98.5 F (36.9 C) (Oral)  Ht 5\' 8"  (1.727 m)  Wt 152 lb (68.947 kg)  BMI 23.12 kg/m2  SpO2 97% General appearance: alert, cooperative, appears stated age and no distress Head: Normocephalic, without obvious abnormality, atraumatic Eyes: conjunctivae/corneas clear. PERRL, EOM's intact. Fundi benign. Ears: normal TM's and external ear canals both ears Nose: Nares normal. Septum midline. Mucosa normal. No drainage or sinus tenderness. Throat: lips, mucosa,  and tongue normal; teeth and gums normal Neck: no adenopathy, no carotid bruit, no JVD, supple, symmetrical, trachea midline and thyroid not enlarged, symmetric, no tenderness/mass/nodules Back: symmetric, no curvature. ROM normal. No CVA tenderness. Lungs: clear to auscultation bilaterally Breasts: normal appearance, no masses or tenderness Heart: regular rate and rhythm, S1, S2 normal, no murmur, click, rub or gallop Abdomen: soft, non-tender; bowel sounds normal; no masses,  no organomegaly Pelvic: cervix normal in appearance, external genitalia normal, no adnexal masses or tenderness, no cervical motion tenderness, rectovaginal septum normal, uterus normal size, shape, and consistency, vagina normal without discharge and vaginal smear  done, rectal heme neg brown stool Extremities: extremities normal, atraumatic, no cyanosis or edema Pulses: 2+ and symmetric Skin: Skin color, texture, turgor normal. No rashes or lesions Lymph nodes: Cervical, supraclavicular, and axillary nodes normal. Neurologic: Alert and oriented X 3, normal strength and tone. Normal symmetric reflexes. Normal coordination and gait     Assessment:    Healthy female exam.      Plan:    ghm utd Check labs See After Visit Summary for Counseling Recommendations \  1. Hyperlipidemia Check labs - Comprehensive metabolic panel - CBC with Differential/Platelet - Lipid panel - POCT urinalysis dipstick - TSH  2. Preventative health care Check labs See AVs See above - Comprehensive metabolic panel - CBC with Differential/Platelet - Lipid panel - POCT urinalysis dipstick - TSH  3. Need for hepatitis C screening test  - Hepatitis C antibody  4. Encounter for screening for HIV  - HIV antibody

## 2015-09-03 NOTE — Progress Notes (Signed)
Pre visit review using our clinic review tool, if applicable. No additional management support is needed unless otherwise documented below in the visit note. 

## 2015-09-03 NOTE — Patient Instructions (Signed)
Preventive Care for Adults, Female A healthy lifestyle and preventive care can promote health and wellness. Preventive health guidelines for women include the following key practices.  A routine yearly physical is a good way to check with your health care provider about your health and preventive screening. It is a chance to share any concerns and updates on your health and to receive a thorough exam.  Visit your dentist for a routine exam and preventive care every 6 months. Brush your teeth twice a day and floss once a day. Good oral hygiene prevents tooth decay and gum disease.  The frequency of eye exams is based on your age, health, family medical history, use of contact lenses, and other factors. Follow your health care provider's recommendations for frequency of eye exams.  Eat a healthy diet. Foods like vegetables, fruits, whole grains, low-fat dairy products, and lean protein foods contain the nutrients you need without too many calories. Decrease your intake of foods high in solid fats, added sugars, and salt. Eat the right amount of calories for you.Get information about a proper diet from your health care provider, if necessary.  Regular physical exercise is one of the most important things you can do for your health. Most adults should get at least 150 minutes of moderate-intensity exercise (any activity that increases your heart rate and causes you to sweat) each week. In addition, most adults need muscle-strengthening exercises on 2 or more days a week.  Maintain a healthy weight. The body mass index (BMI) is a screening tool to identify possible weight problems. It provides an estimate of body fat based on height and weight. Your health care provider can find your BMI and can help you achieve or maintain a healthy weight.For adults 20 years and older:  A BMI below 18.5 is considered underweight.  A BMI of 18.5 to 24.9 is normal.  A BMI of 25 to 29.9 is considered overweight.  A  BMI of 30 and above is considered obese.  Maintain normal blood lipids and cholesterol levels by exercising and minimizing your intake of saturated fat. Eat a balanced diet with plenty of fruit and vegetables. Blood tests for lipids and cholesterol should begin at age 45 and be repeated every 5 years. If your lipid or cholesterol levels are high, you are over 50, or you are at high risk for heart disease, you may need your cholesterol levels checked more frequently.Ongoing high lipid and cholesterol levels should be treated with medicines if diet and exercise are not working.  If you smoke, find out from your health care provider how to quit. If you do not use tobacco, do not start.  Lung cancer screening is recommended for adults aged 45-80 years who are at high risk for developing lung cancer because of a history of smoking. A yearly low-dose CT scan of the lungs is recommended for people who have at least a 30-pack-year history of smoking and are a current smoker or have quit within the past 15 years. A pack year of smoking is smoking an average of 1 pack of cigarettes a day for 1 year (for example: 1 pack a day for 30 years or 2 packs a day for 15 years). Yearly screening should continue until the smoker has stopped smoking for at least 15 years. Yearly screening should be stopped for people who develop a health problem that would prevent them from having lung cancer treatment.  If you are pregnant, do not drink alcohol. If you are  breastfeeding, be very cautious about drinking alcohol. If you are not pregnant and choose to drink alcohol, do not have more than 1 drink per day. One drink is considered to be 12 ounces (355 mL) of beer, 5 ounces (148 mL) of wine, or 1.5 ounces (44 mL) of liquor.  Avoid use of street drugs. Do not share needles with anyone. Ask for help if you need support or instructions about stopping the use of drugs.  High blood pressure causes heart disease and increases the risk  of stroke. Your blood pressure should be checked at least every 1 to 2 years. Ongoing high blood pressure should be treated with medicines if weight loss and exercise do not work.  If you are 55-79 years old, ask your health care provider if you should take aspirin to prevent strokes.  Diabetes screening is done by taking a blood sample to check your blood glucose level after you have not eaten for a certain period of time (fasting). If you are not overweight and you do not have risk factors for diabetes, you should be screened once every 3 years starting at age 45. If you are overweight or obese and you are 40-70 years of age, you should be screened for diabetes every year as part of your cardiovascular risk assessment.  Breast cancer screening is essential preventive care for women. You should practice "breast self-awareness." This means understanding the normal appearance and feel of your breasts and may include breast self-examination. Any changes detected, no matter how small, should be reported to a health care provider. Women in their 20s and 30s should have a clinical breast exam (CBE) by a health care provider as part of a regular health exam every 1 to 3 years. After age 40, women should have a CBE every year. Starting at age 40, women should consider having a mammogram (breast X-ray test) every year. Women who have a family history of breast cancer should talk to their health care provider about genetic screening. Women at a high risk of breast cancer should talk to their health care providers about having an MRI and a mammogram every year.  Breast cancer gene (BRCA)-related cancer risk assessment is recommended for women who have family members with BRCA-related cancers. BRCA-related cancers include breast, ovarian, tubal, and peritoneal cancers. Having family members with these cancers may be associated with an increased risk for harmful changes (mutations) in the breast cancer genes BRCA1 and  BRCA2. Results of the assessment will determine the need for genetic counseling and BRCA1 and BRCA2 testing.  Your health care provider may recommend that you be screened regularly for cancer of the pelvic organs (ovaries, uterus, and vagina). This screening involves a pelvic examination, including checking for microscopic changes to the surface of your cervix (Pap test). You may be encouraged to have this screening done every 3 years, beginning at age 21.  For women ages 30-65, health care providers may recommend pelvic exams and Pap testing every 3 years, or they may recommend the Pap and pelvic exam, combined with testing for human papilloma virus (HPV), every 5 years. Some types of HPV increase your risk of cervical cancer. Testing for HPV may also be done on women of any age with unclear Pap test results.  Other health care providers may not recommend any screening for nonpregnant women who are considered low risk for pelvic cancer and who do not have symptoms. Ask your health care provider if a screening pelvic exam is right for   you.  If you have had past treatment for cervical cancer or a condition that could lead to cancer, you need Pap tests and screening for cancer for at least 20 years after your treatment. If Pap tests have been discontinued, your risk factors (such as having a new sexual partner) need to be reassessed to determine if screening should resume. Some women have medical problems that increase the chance of getting cervical cancer. In these cases, your health care provider may recommend more frequent screening and Pap tests.  Colorectal cancer can be detected and often prevented. Most routine colorectal cancer screening begins at the age of 50 years and continues through age 75 years. However, your health care provider may recommend screening at an earlier age if you have risk factors for colon cancer. On a yearly basis, your health care provider may provide home test kits to check  for hidden blood in the stool. Use of a small camera at the end of a tube, to directly examine the colon (sigmoidoscopy or colonoscopy), can detect the earliest forms of colorectal cancer. Talk to your health care provider about this at age 50, when routine screening begins. Direct exam of the colon should be repeated every 5-10 years through age 75 years, unless early forms of precancerous polyps or small growths are found.  People who are at an increased risk for hepatitis B should be screened for this virus. You are considered at high risk for hepatitis B if:  You were born in a country where hepatitis B occurs often. Talk with your health care provider about which countries are considered high risk.  Your parents were born in a high-risk country and you have not received a shot to protect against hepatitis B (hepatitis B vaccine).  You have HIV or AIDS.  You use needles to inject street drugs.  You live with, or have sex with, someone who has hepatitis B.  You get hemodialysis treatment.  You take certain medicines for conditions like cancer, organ transplantation, and autoimmune conditions.  Hepatitis C blood testing is recommended for all people born from 1945 through 1965 and any individual with known risks for hepatitis C.  Practice safe sex. Use condoms and avoid high-risk sexual practices to reduce the spread of sexually transmitted infections (STIs). STIs include gonorrhea, chlamydia, syphilis, trichomonas, herpes, HPV, and human immunodeficiency virus (HIV). Herpes, HIV, and HPV are viral illnesses that have no cure. They can result in disability, cancer, and death.  You should be screened for sexually transmitted illnesses (STIs) including gonorrhea and chlamydia if:  You are sexually active and are younger than 24 years.  You are older than 24 years and your health care provider tells you that you are at risk for this type of infection.  Your sexual activity has changed  since you were last screened and you are at an increased risk for chlamydia or gonorrhea. Ask your health care provider if you are at risk.  If you are at risk of being infected with HIV, it is recommended that you take a prescription medicine daily to prevent HIV infection. This is called preexposure prophylaxis (PrEP). You are considered at risk if:  You are sexually active and do not regularly use condoms or know the HIV status of your partner(s).  You take drugs by injection.  You are sexually active with a partner who has HIV.  Talk with your health care provider about whether you are at high risk of being infected with HIV. If   you choose to begin PrEP, you should first be tested for HIV. You should then be tested every 3 months for as long as you are taking PrEP.  Osteoporosis is a disease in which the bones lose minerals and strength with aging. This can result in serious bone fractures or breaks. The risk of osteoporosis can be identified using a bone density scan. Women ages 67 years and over and women at risk for fractures or osteoporosis should discuss screening with their health care providers. Ask your health care provider whether you should take a calcium supplement or vitamin D to reduce the rate of osteoporosis.  Menopause can be associated with physical symptoms and risks. Hormone replacement therapy is available to decrease symptoms and risks. You should talk to your health care provider about whether hormone replacement therapy is right for you.  Use sunscreen. Apply sunscreen liberally and repeatedly throughout the day. You should seek shade when your shadow is shorter than you. Protect yourself by wearing long sleeves, pants, a wide-brimmed hat, and sunglasses year round, whenever you are outdoors.  Once a month, do a whole body skin exam, using a mirror to look at the skin on your back. Tell your health care provider of new moles, moles that have irregular borders, moles that  are larger than a pencil eraser, or moles that have changed in shape or color.  Stay current with required vaccines (immunizations).  Influenza vaccine. All adults should be immunized every year.  Tetanus, diphtheria, and acellular pertussis (Td, Tdap) vaccine. Pregnant women should receive 1 dose of Tdap vaccine during each pregnancy. The dose should be obtained regardless of the length of time since the last dose. Immunization is preferred during the 27th-36th week of gestation. An adult who has not previously received Tdap or who does not know her vaccine status should receive 1 dose of Tdap. This initial dose should be followed by tetanus and diphtheria toxoids (Td) booster doses every 10 years. Adults with an unknown or incomplete history of completing a 3-dose immunization series with Td-containing vaccines should begin or complete a primary immunization series including a Tdap dose. Adults should receive a Td booster every 10 years.  Varicella vaccine. An adult without evidence of immunity to varicella should receive 2 doses or a second dose if she has previously received 1 dose. Pregnant females who do not have evidence of immunity should receive the first dose after pregnancy. This first dose should be obtained before leaving the health care facility. The second dose should be obtained 4-8 weeks after the first dose.  Human papillomavirus (HPV) vaccine. Females aged 13-26 years who have not received the vaccine previously should obtain the 3-dose series. The vaccine is not recommended for use in pregnant females. However, pregnancy testing is not needed before receiving a dose. If a female is found to be pregnant after receiving a dose, no treatment is needed. In that case, the remaining doses should be delayed until after the pregnancy. Immunization is recommended for any person with an immunocompromised condition through the age of 61 years if she did not get any or all doses earlier. During the  3-dose series, the second dose should be obtained 4-8 weeks after the first dose. The third dose should be obtained 24 weeks after the first dose and 16 weeks after the second dose.  Zoster vaccine. One dose is recommended for adults aged 30 years or older unless certain conditions are present.  Measles, mumps, and rubella (MMR) vaccine. Adults born  before 1957 generally are considered immune to measles and mumps. Adults born in 1957 or later should have 1 or more doses of MMR vaccine unless there is a contraindication to the vaccine or there is laboratory evidence of immunity to each of the three diseases. A routine second dose of MMR vaccine should be obtained at least 28 days after the first dose for students attending postsecondary schools, health care workers, or international travelers. People who received inactivated measles vaccine or an unknown type of measles vaccine during 1963-1967 should receive 2 doses of MMR vaccine. People who received inactivated mumps vaccine or an unknown type of mumps vaccine before 1979 and are at high risk for mumps infection should consider immunization with 2 doses of MMR vaccine. For females of childbearing age, rubella immunity should be determined. If there is no evidence of immunity, females who are not pregnant should be vaccinated. If there is no evidence of immunity, females who are pregnant should delay immunization until after pregnancy. Unvaccinated health care workers born before 1957 who lack laboratory evidence of measles, mumps, or rubella immunity or laboratory confirmation of disease should consider measles and mumps immunization with 2 doses of MMR vaccine or rubella immunization with 1 dose of MMR vaccine.  Pneumococcal 13-valent conjugate (PCV13) vaccine. When indicated, a person who is uncertain of his immunization history and has no record of immunization should receive the PCV13 vaccine. All adults 65 years of age and older should receive this  vaccine. An adult aged 19 years or older who has certain medical conditions and has not been previously immunized should receive 1 dose of PCV13 vaccine. This PCV13 should be followed with a dose of pneumococcal polysaccharide (PPSV23) vaccine. Adults who are at high risk for pneumococcal disease should obtain the PPSV23 vaccine at least 8 weeks after the dose of PCV13 vaccine. Adults older than 57 years of age who have normal immune system function should obtain the PPSV23 vaccine dose at least 1 year after the dose of PCV13 vaccine.  Pneumococcal polysaccharide (PPSV23) vaccine. When PCV13 is also indicated, PCV13 should be obtained first. All adults aged 65 years and older should be immunized. An adult younger than age 65 years who has certain medical conditions should be immunized. Any person who resides in a nursing home or long-term care facility should be immunized. An adult smoker should be immunized. People with an immunocompromised condition and certain other conditions should receive both PCV13 and PPSV23 vaccines. People with human immunodeficiency virus (HIV) infection should be immunized as soon as possible after diagnosis. Immunization during chemotherapy or radiation therapy should be avoided. Routine use of PPSV23 vaccine is not recommended for American Indians, [place] Natives, or people younger than 65 years unless there are medical conditions that require PPSV23 vaccine. When indicated, people who have unknown immunization and have no record of immunization should receive PPSV23 vaccine. One-time revaccination 5 years after the first dose of PPSV23 is recommended for people aged 19-64 years who have chronic kidney failure, nephrotic syndrome, asplenia, or immunocompromised conditions. People who received 1-2 doses of PPSV23 before age 65 years should receive another dose of PPSV23 vaccine at age 65 years or later if at least 5 years have passed since the previous dose. Doses of PPSV23 are not  needed for people immunized with PPSV23 at or after age 65 years.  Meningococcal vaccine. Adults with asplenia or persistent complement component deficiencies should receive 2 doses of quadrivalent meningococcal conjugate (MenACWY-D) vaccine. The doses should be obtained   at least 2 months apart. Microbiologists working with certain meningococcal bacteria, [place] recruits, people at risk during an outbreak, and people who travel to or live in countries with a high rate of meningitis should be immunized. A first-year college student up through age 34 years who is living in a residence hall should receive a dose if she did not receive a dose on or after her 16th birthday. Adults who have certain high-risk conditions should receive one or more doses of vaccine.  Hepatitis A vaccine. Adults who wish to be protected from this disease, have certain high-risk conditions, work with hepatitis A-infected animals, work in hepatitis A research labs, or travel to or work in countries with a high rate of hepatitis A should be immunized. Adults who were previously unvaccinated and who anticipate close contact with an international adoptee during the first 60 days after arrival in the [place] from a country with a high rate of hepatitis A should be immunized.  Hepatitis B vaccine. Adults who wish to be protected from this disease, have certain high-risk conditions, may be exposed to blood or other infectious body fluids, are household contacts or sex partners of hepatitis B positive people, are clients or workers in certain care facilities, or travel to or work in countries with a high rate of hepatitis B should be immunized.  Haemophilus influenzae type b (Hib) vaccine. A previously unvaccinated person with asplenia or sickle cell disease or having a scheduled splenectomy should receive 1 dose of Hib vaccine. Regardless of previous immunization, a recipient of a hematopoietic stem cell transplant should receive a  3-dose series 6-12 months after her successful transplant. Hib vaccine is not recommended for adults with HIV infection. Preventive Services / Frequency Ages 35 to 4 years  Blood pressure check.** / Every 3-5 years.  Lipid and cholesterol check.** / Every 5 years beginning at age 60.  Clinical breast exam.** / Every 3 years for women in their 71s and 10s.  BRCA-related cancer risk assessment.** / For women who have family members with a BRCA-related cancer (breast, ovarian, tubal, or peritoneal cancers).  Pap test.** / Every 2 years from ages 76 through 26. Every 3 years starting at age 61 through age 76 or 93 with a history of 3 consecutive normal Pap tests.  HPV screening.** / Every 3 years from ages 37 through ages 60 to 51 with a history of 3 consecutive normal Pap tests.  Hepatitis C blood test.** / For any individual with known risks for hepatitis C.  Skin self-exam. / Monthly.  Influenza vaccine. / Every year.  Tetanus, diphtheria, and acellular pertussis (Tdap, Td) vaccine.** / Consult your health care provider. Pregnant women should receive 1 dose of Tdap vaccine during each pregnancy. 1 dose of Td every 10 years.  Varicella vaccine.** / Consult your health care provider. Pregnant females who do not have evidence of immunity should receive the first dose after pregnancy.  HPV vaccine. / 3 doses over 6 months, if 93 and younger. The vaccine is not recommended for use in pregnant females. However, pregnancy testing is not needed before receiving a dose.  Measles, mumps, rubella (MMR) vaccine.** / You need at least 1 dose of MMR if you were born in 1957 or later. You may also need a 2nd dose. For females of childbearing age, rubella immunity should be determined. If there is no evidence of immunity, females who are not pregnant should be vaccinated. If there is no evidence of immunity, females who are  pregnant should delay immunization until after pregnancy.  Pneumococcal  13-valent conjugate (PCV13) vaccine.** / Consult your health care provider.  Pneumococcal polysaccharide (PPSV23) vaccine.** / 1 to 2 doses if you smoke cigarettes or if you have certain conditions.  Meningococcal vaccine.** / 1 dose if you are age 68 to 8 years and a Market researcher living in a residence hall, or have one of several medical conditions, you need to get vaccinated against meningococcal disease. You may also need additional booster doses.  Hepatitis A vaccine.** / Consult your health care provider.  Hepatitis B vaccine.** / Consult your health care provider.  Haemophilus influenzae type b (Hib) vaccine.** / Consult your health care provider. Ages 7 to 53 years  Blood pressure check.** / Every year.  Lipid and cholesterol check.** / Every 5 years beginning at age 25 years.  Lung cancer screening. / Every year if you are aged 11-80 years and have a 30-pack-year history of smoking and currently smoke or have quit within the past 15 years. Yearly screening is stopped once you have quit smoking for at least 15 years or develop a health problem that would prevent you from having lung cancer treatment.  Clinical breast exam.** / Every year after age 48 years.  BRCA-related cancer risk assessment.** / For women who have family members with a BRCA-related cancer (breast, ovarian, tubal, or peritoneal cancers).  Mammogram.** / Every year beginning at age 41 years and continuing for as long as you are in good health. Consult with your health care provider.  Pap test.** / Every 3 years starting at age 65 years through age 37 or 70 years with a history of 3 consecutive normal Pap tests.  HPV screening.** / Every 3 years from ages 72 years through ages 60 to 40 years with a history of 3 consecutive normal Pap tests.  Fecal occult blood test (FOBT) of stool. / Every year beginning at age 21 years and continuing until age 5 years. You may not need to do this test if you get  a colonoscopy every 10 years.  Flexible sigmoidoscopy or colonoscopy.** / Every 5 years for a flexible sigmoidoscopy or every 10 years for a colonoscopy beginning at age 35 years and continuing until age 48 years.  Hepatitis C blood test.** / For all people born from [birth year] through 1965 and any individual with known risks for hepatitis C.  Skin self-exam. / Monthly.  Influenza vaccine. / Every year.  Tetanus, diphtheria, and acellular pertussis (Tdap/Td) vaccine.** / Consult your health care provider. Pregnant women should receive 1 dose of Tdap vaccine during each pregnancy. 1 dose of Td every 10 years.  Varicella vaccine.** / Consult your health care provider. Pregnant females who do not have evidence of immunity should receive the first dose after pregnancy.  Zoster vaccine.** / 1 dose for adults aged 30 years or older.  Measles, mumps, rubella (MMR) vaccine.** / You need at least 1 dose of MMR if you were born in 1957 or later. You may also need a second dose. For females of childbearing age, rubella immunity should be determined. If there is no evidence of immunity, females who are not pregnant should be vaccinated. If there is no evidence of immunity, females who are pregnant should delay immunization until after pregnancy.  Pneumococcal 13-valent conjugate (PCV13) vaccine.** / Consult your health care provider.  Pneumococcal polysaccharide (PPSV23) vaccine.** / 1 to 2 doses if you smoke cigarettes or if you have certain conditions.  Meningococcal vaccine.** /  Consult your health care provider.  Hepatitis A vaccine.** / Consult your health care provider.  Hepatitis B vaccine.** / Consult your health care provider.  Haemophilus influenzae type b (Hib) vaccine.** / Consult your health care provider. Ages 64 years and over  Blood pressure check.** / Every year.  Lipid and cholesterol check.** / Every 5 years beginning at age 23 years.  Lung cancer screening. / Every year if you  are aged 16-80 years and have a 30-pack-year history of smoking and currently smoke or have quit within the past 15 years. Yearly screening is stopped once you have quit smoking for at least 15 years or develop a health problem that would prevent you from having lung cancer treatment.  Clinical breast exam.** / Every year after age 74 years.  BRCA-related cancer risk assessment.** / For women who have family members with a BRCA-related cancer (breast, ovarian, tubal, or peritoneal cancers).  Mammogram.** / Every year beginning at age 44 years and continuing for as long as you are in good health. Consult with your health care provider.  Pap test.** / Every 3 years starting at age 58 years through age 22 or 39 years with 3 consecutive normal Pap tests. Testing can be stopped between 65 and 70 years with 3 consecutive normal Pap tests and no abnormal Pap or HPV tests in the past 10 years.  HPV screening.** / Every 3 years from ages 64 years through ages 70 or 61 years with a history of 3 consecutive normal Pap tests. Testing can be stopped between 65 and 70 years with 3 consecutive normal Pap tests and no abnormal Pap or HPV tests in the past 10 years.  Fecal occult blood test (FOBT) of stool. / Every year beginning at age 40 years and continuing until age 27 years. You may not need to do this test if you get a colonoscopy every 10 years.  Flexible sigmoidoscopy or colonoscopy.** / Every 5 years for a flexible sigmoidoscopy or every 10 years for a colonoscopy beginning at age 7 years and continuing until age 32 years.  Hepatitis C blood test.** / For all people born from [birth year] through 1965 and any individual with known risks for hepatitis C.  Osteoporosis screening.** / A one-time screening for women ages 30 years and over and women at risk for fractures or osteoporosis.  Skin self-exam. / Monthly.  Influenza vaccine. / Every year.  Tetanus, diphtheria, and acellular pertussis (Tdap/Td)  vaccine.** / 1 dose of Td every 10 years.  Varicella vaccine.** / Consult your health care provider.  Zoster vaccine.** / 1 dose for adults aged 35 years or older.  Pneumococcal 13-valent conjugate (PCV13) vaccine.** / Consult your health care provider.  Pneumococcal polysaccharide (PPSV23) vaccine.** / 1 dose for all adults aged 46 years and older.  Meningococcal vaccine.** / Consult your health care provider.  Hepatitis A vaccine.** / Consult your health care provider.  Hepatitis B vaccine.** / Consult your health care provider.  Haemophilus influenzae type b (Hib) vaccine.** / Consult your health care provider. ** Family history and personal history of risk and conditions may change your health care provider's recommendations.   This information is not intended to replace advice given to you by your health care provider. Make sure you discuss any questions you have with your health care provider.   Document Released: 06/03/2001 Document Revised: 04/28/2014 Document Reviewed: 09/02/2010 Elsevier Interactive Patient Education [employer].

## 2015-09-04 ENCOUNTER — Other Ambulatory Visit (HOSPITAL_COMMUNITY)
Admission: RE | Admit: 2015-09-04 | Discharge: 2015-09-04 | Disposition: A | Discharge status: Home / Self Care | Payer: BLUE CROSS/BLUE SHIELD | Source: Ambulatory Visit | Attending: Family Medicine | Admitting: Family Medicine

## 2015-09-04 DIAGNOSIS — Z01419 Encounter for gynecological examination (general) (routine) without abnormal findings: Secondary | ICD-10-CM | POA: Insufficient documentation

## 2015-09-04 LAB — CBC WITH DIFFERENTIAL/PLATELET
Basophils Absolute: 0 10*3/uL (ref 0.0–0.1)
Basophils Relative: 0.5 % (ref 0.0–3.0)
EOS PCT: 2.1 % (ref 0.0–5.0)
Eosinophils Absolute: 0.2 10*3/uL (ref 0.0–0.7)
HCT: 41 % (ref 36.0–46.0)
HEMOGLOBIN: 14 g/dL (ref 12.0–15.0)
Lymphocytes Relative: 24.6 % (ref 12.0–46.0)
Lymphs Abs: 1.9 10*3/uL (ref 0.7–4.0)
MCHC: 34.1 g/dL (ref 30.0–36.0)
MCV: 91.1 fl (ref 78.0–100.0)
Monocytes Absolute: 0.6 10*3/uL (ref 0.1–1.0)
Monocytes Relative: 8.1 % (ref 3.0–12.0)
Neutro Abs: 4.9 10*3/uL (ref 1.4–7.7)
Neutrophils Relative %: 64.7 % (ref 43.0–77.0)
Platelets: 224 10*3/uL (ref 150.0–400.0)
RBC: 4.5 Mil/uL (ref 3.87–5.11)
RDW: 13 % (ref 11.5–15.5)
WBC: 7.6 10*3/uL (ref 4.0–10.5)

## 2015-09-04 LAB — COMPREHENSIVE METABOLIC PANEL
ALK PHOS: 48 U/L (ref 39–117)
ALT: 17 U/L (ref 0–35)
AST: 21 U/L (ref 0–37)
Albumin: 4.4 g/dL (ref 3.5–5.2)
BUN: 19 mg/dL (ref 6–23)
CO2: 29 meq/L (ref 19–32)
Calcium: 9.6 mg/dL (ref 8.4–10.5)
Chloride: 103 mEq/L (ref 96–112)
Creatinine, Ser: 1.06 mg/dL (ref 0.40–1.20)
GFR: 56.8 mL/min — ABNORMAL LOW (ref >60.00)
GLUCOSE: 89 mg/dL (ref 70–99)
Potassium: 4.7 mEq/L (ref 3.5–5.1)
SODIUM: 139 meq/L (ref 135–145)
TOTAL PROTEIN: 7.6 g/dL (ref 6.0–8.3)
Total Bilirubin: 0.4 mg/dL (ref 0.2–1.2)

## 2015-09-04 LAB — LIPID PANEL
CHOL/HDL RATIO: 3
Cholesterol: 153 mg/dL (ref 0–200)
HDL: 46.5 mg/dL (ref >39.00)
LDL CALC: 85 mg/dL (ref 0–99)
NonHDL: 106.64
Triglycerides: 108 mg/dL (ref 0.0–149.0)
VLDL: 21.6 mg/dL (ref 0.0–40.0)

## 2015-09-04 LAB — HIV ANTIBODY (ROUTINE TESTING W REFLEX): HIV 1&2 Ab, 4th Generation: NONREACTIVE

## 2015-09-04 LAB — HEPATITIS C ANTIBODY: HCV Ab: NEGATIVE

## 2015-09-04 LAB — TSH: TSH: 0.84 u[IU]/mL (ref 0.35–4.50)

## 2015-09-04 NOTE — Addendum Note (Signed)
Addended by: Harl Bowie on: 09/04/2015 07:27 AM   Modules accepted: Orders

## 2015-09-04 NOTE — Addendum Note (Signed)
Addended by: Harl Bowie on: 09/04/2015 09:22 AM   Modules accepted: Orders

## 2015-09-05 LAB — CYTOLOGY - PAP

## 2015-09-06 ENCOUNTER — Encounter: Payer: Self-pay | Admitting: *Deleted

## 2015-10-07 ENCOUNTER — Other Ambulatory Visit: Payer: Self-pay | Admitting: Family Medicine

## 2015-11-14 ENCOUNTER — Other Ambulatory Visit: Payer: Self-pay | Admitting: Family Medicine

## 2015-11-14 DIAGNOSIS — Z1231 Encounter for screening mammogram for malignant neoplasm of breast: Secondary | ICD-10-CM

## 2015-12-11 ENCOUNTER — Ambulatory Visit
Admission: RE | Admit: 2015-12-11 | Discharge: 2015-12-11 | Disposition: A | Discharge status: Home / Self Care | Payer: BLUE CROSS/BLUE SHIELD | Source: Ambulatory Visit | Attending: Family Medicine | Admitting: Family Medicine

## 2015-12-11 DIAGNOSIS — Z1231 Encounter for screening mammogram for malignant neoplasm of breast: Secondary | ICD-10-CM

## 2016-03-30 ENCOUNTER — Other Ambulatory Visit: Payer: Self-pay | Admitting: Family Medicine

## 2016-05-04 ENCOUNTER — Other Ambulatory Visit: Payer: Self-pay | Admitting: Family Medicine

## 2016-06-03 ENCOUNTER — Other Ambulatory Visit: Payer: Self-pay | Admitting: Family Medicine

## 2016-06-23 ENCOUNTER — Other Ambulatory Visit: Payer: Self-pay | Admitting: Family Medicine

## 2016-06-23 MED ORDER — PRAVASTATIN SODIUM 40 MG PO TABS: 40.0000 mg | ORAL_TABLET | Freq: Every day | ORAL | 0 refills | Status: DC

## 2016-06-23 MED ORDER — ESTRADIOL 0.05 MG/24HR TD PTWK: MEDICATED_PATCH | TRANSDERMAL | 0 refills | Status: DC

## 2016-06-23 NOTE — Telephone Encounter (Signed)
Patient notified

## 2016-06-23 NOTE — Telephone Encounter (Signed)
Caller name: Tanya Nelson  Relation to pt: self  Call back number: [phone] Pharmacy: [facility], [address] AT [address]  Reason for call: Pt is requesting is needing to have her next refills for 90 days supply of pravastatin (PRAVACHOL) 40 MG tablet and estradiol (CLIMARA - DOSED IN MG/24 HR) 0.05 mg/24hr patch, Pt states insurance mentioned the pt needs to be done with a 90 day supply and not a monthly refill. Pt has schedule fu appt on 07-24-16. Please advise.

## 2016-07-24 ENCOUNTER — Ambulatory Visit (INDEPENDENT_AMBULATORY_CARE_PROVIDER_SITE_OTHER): Payer: BLUE CROSS/BLUE SHIELD | Admitting: Family Medicine

## 2016-07-24 ENCOUNTER — Encounter: Payer: Self-pay | Admitting: Family Medicine

## 2016-07-24 VITALS — BP 104/62 | HR 60 | Temp 97.9°F | Resp 16 | Ht 68.0 in | Wt 160.0 lb

## 2016-07-24 DIAGNOSIS — E041 Nontoxic single thyroid nodule: Secondary | ICD-10-CM

## 2016-07-24 DIAGNOSIS — E782 Mixed hyperlipidemia: Secondary | ICD-10-CM | POA: Diagnosis not present

## 2016-07-24 DIAGNOSIS — Z0000 Encounter for general adult medical examination without abnormal findings: Secondary | ICD-10-CM

## 2016-07-24 LAB — LIPID PANEL
CHOL/HDL RATIO: 3
Cholesterol: 167 mg/dL (ref 0–200)
HDL: 56.5 mg/dL (ref >39.00)
LDL CALC: 96 mg/dL (ref 0–99)
NonHDL: 110.61
Triglycerides: 72 mg/dL (ref 0.0–149.0)
VLDL: 14.4 mg/dL (ref 0.0–40.0)

## 2016-07-24 LAB — COMPREHENSIVE METABOLIC PANEL
ALT: 20 U/L (ref 0–35)
AST: 24 U/L (ref 0–37)
Albumin: 4.4 g/dL (ref 3.5–5.2)
Alkaline Phosphatase: 50 U/L (ref 39–117)
BUN: 15 mg/dL (ref 6–23)
CHLORIDE: 103 meq/L (ref 96–112)
CO2: 32 mEq/L (ref 19–32)
CREATININE: 0.97 mg/dL (ref 0.40–1.20)
Calcium: 9.8 mg/dL (ref 8.4–10.5)
GFR: 62.73 mL/min (ref >60.00)
GLUCOSE: 96 mg/dL (ref 70–99)
Potassium: 4.4 mEq/L (ref 3.5–5.1)
SODIUM: 140 meq/L (ref 135–145)
Total Bilirubin: 0.4 mg/dL (ref 0.2–1.2)
Total Protein: 7.8 g/dL (ref 6.0–8.3)

## 2016-07-24 LAB — CBC
HCT: 42.2 % (ref 36.0–46.0)
Hemoglobin: 14.4 g/dL (ref 12.0–15.0)
MCHC: 34.1 g/dL (ref 30.0–36.0)
MCV: 90.7 fl (ref 78.0–100.0)
Platelets: 218 10*3/uL (ref 150.0–400.0)
RBC: 4.65 Mil/uL (ref 3.87–5.11)
RDW: 12.7 % (ref 11.5–15.5)
WBC: 4.5 10*3/uL (ref 4.0–10.5)

## 2016-07-24 NOTE — Patient Instructions (Signed)
Cholesterol Cholesterol is a fat. Your body needs a small amount of cholesterol. Cholesterol (plaque) may build up in your blood vessels (arteries). That makes you more likely to have a heart attack or stroke. You cannot feel your cholesterol level. Having a blood test is the only way to find out if your level is high. Keep your test results. Work with your doctor to keep your cholesterol at a good level. What do the results mean?  Total cholesterol is how much cholesterol is in your blood.  LDL is bad cholesterol. This is the type that can build up. Try to have low LDL.  HDL is good cholesterol. It cleans your blood vessels and carries LDL away. Try to have high HDL.  Triglycerides are fat that the body can store or burn for energy. What are good levels of cholesterol?  Total cholesterol below 200.  LDL below 100 is good for people who have health risks. LDL below 70 is good for people who have very high risks.  HDL above 40 is good. It is best to have HDL of 60 or higher.  Triglycerides below 150. How can I lower my cholesterol? Diet  Follow your diet program as told by your doctor.  Choose fish, white meat chicken, or turkey that is roasted or baked. Try not to eat red meat, fried foods, sausage, or lunch meats.  Eat lots of fresh fruits and vegetables.  Choose whole grains, beans, pasta, potatoes, and cereals.  Choose olive oil, corn oil, or canola oil. Only use small amounts.  Try not to eat butter, mayonnaise, shortening, or palm kernel oils.  Try not to eat foods with trans fats.  Choose low-fat or nonfat dairy foods.  Drink skim or nonfat milk.  Eat low-fat or nonfat yogurt and cheeses.  Try not to drink whole milk or cream.  Try not to eat ice cream, egg yolks, or full-fat cheeses.  Healthy desserts include angel food cake, ginger snaps, animal crackers, hard candy, popsicles, and low-fat or nonfat frozen yogurt. Try not to eat pastries, cakes, pies, and  cookies. Exercise  Follow your exercise program as told by your doctor.  Be more active. Try gardening, walking, and taking the stairs.  Ask your doctor about ways that you can be more active. Medicine  Take over-the-counter and prescription medicines only as told by your doctor. This information is not intended to replace advice given to you by your health care provider. Make sure you discuss any questions you have with your health care provider. Document Released: 07/04/2008 Document Revised: 11/07/2015 Document Reviewed: 10/18/2015 Elsevier Interactive Patient Education  2017 Elsevier Inc.  

## 2016-07-24 NOTE — Assessment & Plan Note (Signed)
Tolerating statin, encouraged heart healthy diet, avoid trans fats, minimize simple carbs and saturated fats. Increase exercise as tolerated 

## 2016-07-24 NOTE — Progress Notes (Signed)
Pre visit review using our clinic review tool, if applicable. No additional management support is needed unless otherwise documented below in the visit note. 

## 2016-07-24 NOTE — Progress Notes (Signed)
Subjective:  I acted as a Education administrator for Dr. Royden Purl, LPN    Patient ID: Tanya Nelson, female    DOB: 07-05-1960, 56 y.o.   MRN: [id number]  Chief Complaint  Patient presents with  . Hyperlipidemia    Hyperlipidemia  This is a chronic problem. The current episode started more than 1 year ago. Pertinent negatives include no chest pain.    Patient is in today for medication follow up, and to check labs. Informed patient about the new Shringrix vaccine, patient she would like to research first and then decide. Patient have no questions or concerns at this time.  Patient Care Team: Ann Held, DO as PCP - General   Past Medical History:  Diagnosis Date  . Acute lymphadenitis   . Cancer (HCC)    Basal Cell--Face  . Heart murmur     Past Surgical History:  Procedure Laterality Date  . ABDOMINAL HYSTERECTOMY    . MOHS SURGERY  08/2008   [facility]    Family History  Problem Relation Age of Onset  . Prostate cancer Father   . Hypertension Mother     Brain aneurysm  . Aneurysm Mother   . Hypertension      Social History   Social History  . Marital status: Married    Spouse name: N/A  . Number of children: N/A  . Years of education: N/A   Occupational History  . [employer]   Social History Main Topics  . Smoking status: Never Smoker  . Smokeless tobacco: Never Used  . Alcohol use Yes     Comment: rare  . Drug use: No  . Sexual activity: Yes    Partners: Male   Other Topics Concern  . Not on file   Social History Narrative   Exercise-no    Outpatient Medications Prior to Visit  Medication Sig Dispense Refill  . aspirin EC 81 MG EC tablet Take 81 mg by mouth daily.      Marland Kitchen estradiol (CLIMARA - DOSED IN MG/24 HR) 0.05 mg/24hr patch APPLY 1 PATCH EXTERNALLY TO THE SKIN EVERY WEEK 12 patch 0  . Multiple Vitamin (MULTIVITAMIN) tablet Take 1 tablet by mouth daily.      . pravastatin (PRAVACHOL) 40 MG tablet Take 1 tablet (40 mg total) by  mouth daily. Past due for OV 90 tablet 0  . QNASL 80 MCG/ACT AERS INSTILL 2 SPRAYS IN EACH NOSTRIL EVERY DAY 8.7 g 1   No facility-administered medications prior to visit.     No Known Allergies  Review of Systems  Constitutional: Negative for fever.  HENT: Negative for congestion.   Eyes: Negative for blurred vision.  Respiratory: Negative for cough.   Cardiovascular: Negative for chest pain and palpitations.  Gastrointestinal: Negative for vomiting.  Musculoskeletal: Negative for back pain.  Skin: Negative for rash.  Neurological: Negative for loss of consciousness and headaches.       Objective:    Physical Exam  Constitutional: She is oriented to person, place, and time. She appears well-developed and well-nourished. No distress.  HENT:  Head: Normocephalic and atraumatic.  Eyes: Conjunctivae are normal. Pupils are equal, round, and reactive to light.  Neck: Normal range of motion. No thyromegaly present.  Cardiovascular: Normal rate and regular rhythm.   No murmur heard. Pulmonary/Chest: Effort normal and breath sounds normal. She has no wheezes.  Abdominal: Soft. Bowel sounds are normal. There is no tenderness.  Musculoskeletal: Normal range of motion. She  exhibits no edema or deformity.  Neurological: She is alert and oriented to person, place, and time.  Skin: Skin is warm and dry. She is not diaphoretic.  Psychiatric: She has a normal mood and affect. Her behavior is normal. Judgment and thought content normal.  Nursing note and vitals reviewed.   BP 104/62 (BP Location: Left Arm, Patient Position: Sitting, Cuff Size: Normal)   Pulse 60   Temp 97.9 F (36.6 C) (Oral)   Resp 16   Ht 5\' 8"  (1.727 m)   Wt 160 lb (72.6 kg)   SpO2 97%   BMI 24.33 kg/m  Wt Readings from Last 3 Encounters:  07/24/16 160 lb (72.6 kg)  09/03/15 152 lb (68.9 kg)  10/13/14 156 lb 12.8 oz (71.1 kg)   BP Readings from Last 3 Encounters:  07/24/16 104/62  09/03/15 124/60  10/13/14  110/60     Immunization History  Administered Date(s) Administered  . Hepatitis A 11/28/2009, 07/03/2010  . Influenza Whole 03/04/2006, 03/05/2007, 02/09/2008, 01/18/2009, 11/28/2009  . Influenza,inj,Quad PF,36+ Mos 12/31/2012  . Influenza-Unspecified 01/08/2014  . Td 04/22/1999, 04/24/2009  . Typhoid Parenteral 11/28/2009    Health Maintenance  Topic Date Due  . INFLUENZA VACCINE  11/19/2016  . MAMMOGRAM  12/10/2017  . PAP SMEAR  09/03/2018  . TETANUS/TDAP  04/25/2019  . COLONOSCOPY  06/07/2020  . Hepatitis C Screening  Completed  . HIV Screening  Completed    Lab Results  Component Value Date   WBC 7.6 09/03/2015   HGB 14.0 09/03/2015   HCT 41.0 09/03/2015   PLT 224.0 09/03/2015   GLUCOSE 89 09/03/2015   CHOL 153 09/03/2015   TRIG 108.0 09/03/2015   HDL 46.50 09/03/2015   LDLDIRECT 158.7 03/31/2008   LDLCALC 85 09/03/2015   ALT 17 09/03/2015   AST 21 09/03/2015   NA 139 09/03/2015   K 4.7 09/03/2015   CL 103 09/03/2015   CREATININE 1.06 09/03/2015   BUN 19 09/03/2015   CO2 29 09/03/2015   TSH 0.84 09/03/2015    Lab Results  Component Value Date   TSH 0.84 09/03/2015   Lab Results  Component Value Date   WBC 7.6 09/03/2015   HGB 14.0 09/03/2015   HCT 41.0 09/03/2015   MCV 91.1 09/03/2015   PLT 224.0 09/03/2015   Lab Results  Component Value Date   NA 139 09/03/2015   K 4.7 09/03/2015   CO2 29 09/03/2015   GLUCOSE 89 09/03/2015   BUN 19 09/03/2015   CREATININE 1.06 09/03/2015   BILITOT 0.4 09/03/2015   ALKPHOS 48 09/03/2015   AST 21 09/03/2015   ALT 17 09/03/2015   PROT 7.6 09/03/2015   ALBUMIN 4.4 09/03/2015   CALCIUM 9.6 09/03/2015   GFR 56.80 (L) 09/03/2015   Lab Results  Component Value Date   CHOL 153 09/03/2015   Lab Results  Component Value Date   HDL 46.50 09/03/2015   Lab Results  Component Value Date   LDLCALC 85 09/03/2015   Lab Results  Component Value Date   TRIG 108.0 09/03/2015   Lab Results  Component Value  Date   CHOLHDL 3 09/03/2015   No results found for: HGBA1C       Assessment & Plan:   Problem List Items Addressed This Visit      Unprioritized   Hyperlipidemia - Primary    Tolerating statin, encouraged heart healthy diet, avoid trans fats, minimize simple carbs and saturated fats. Increase exercise as tolerated  Relevant Orders   Lipid panel    Other Visit Diagnoses    Preventative health care       Relevant Orders   CBC   Comprehensive metabolic panel   Thyroid nodule       Relevant Orders   TSH+T4F+T3Free      I am having Ms. Nieman maintain her aspirin EC, multivitamin, QNASL, pravastatin, and estradiol.  No orders of the defined types were placed in this encounter.   CMA served as Education administrator during this visit. History, Physical and Plan performed by medical provider. Documentation and orders reviewed and attested to.  Ann Held, DO   Patient ID: JADAE STEINKE, female   DOB: 1960-11-07, 56 y.o.   MRN: [id number]

## 2016-07-25 LAB — TSH+T4F+T3FREE
Free T4: 1.48 ng/dL (ref 0.82–1.77)
T3, Free: 2.6 pg/mL (ref 2.0–4.4)
TSH: 0.878 u[IU]/mL (ref 0.450–4.500)

## 2016-10-01 ENCOUNTER — Other Ambulatory Visit: Payer: Self-pay | Admitting: Family Medicine

## 2016-10-05 ENCOUNTER — Other Ambulatory Visit: Payer: Self-pay | Admitting: Family Medicine

## 2016-12-28 ENCOUNTER — Other Ambulatory Visit: Payer: Self-pay | Admitting: Family Medicine

## 2017-01-28 ENCOUNTER — Other Ambulatory Visit: Payer: Self-pay | Admitting: Family Medicine

## 2017-01-28 DIAGNOSIS — Z1231 Encounter for screening mammogram for malignant neoplasm of breast: Secondary | ICD-10-CM

## 2017-02-11 ENCOUNTER — Ambulatory Visit
Admission: RE | Admit: 2017-02-11 | Discharge: 2017-02-11 | Disposition: A | Discharge status: Home / Self Care | Payer: BLUE CROSS/BLUE SHIELD | Source: Ambulatory Visit | Attending: Family Medicine | Admitting: Family Medicine

## 2017-02-11 DIAGNOSIS — Z1231 Encounter for screening mammogram for malignant neoplasm of breast: Secondary | ICD-10-CM

## 2017-02-19 ENCOUNTER — Encounter: Payer: Self-pay | Admitting: Family Medicine

## 2017-02-19 ENCOUNTER — Ambulatory Visit (INDEPENDENT_AMBULATORY_CARE_PROVIDER_SITE_OTHER): Payer: BLUE CROSS/BLUE SHIELD | Admitting: Family Medicine

## 2017-02-19 VITALS — BP 122/80 | HR 72 | Temp 98.2°F | Resp 16 | Ht 68.0 in | Wt 162.2 lb

## 2017-02-19 DIAGNOSIS — R079 Chest pain, unspecified: Secondary | ICD-10-CM | POA: Diagnosis not present

## 2017-02-19 DIAGNOSIS — F419 Anxiety disorder, unspecified: Secondary | ICD-10-CM | POA: Diagnosis not present

## 2017-02-19 DIAGNOSIS — M62838 Other muscle spasm: Secondary | ICD-10-CM | POA: Diagnosis not present

## 2017-02-19 MED ORDER — CYCLOBENZAPRINE HCL 10 MG PO TABS: 10.0000 mg | ORAL_TABLET | Freq: Three times a day (TID) | ORAL | 0 refills | Status: DC | PRN

## 2017-02-19 MED ORDER — ESCITALOPRAM OXALATE 10 MG PO TABS: 10.0000 mg | ORAL_TABLET | Freq: Every day | ORAL | 2 refills | Status: DC

## 2017-02-19 NOTE — Progress Notes (Signed)
Patient ID: Tanya Nelson, female   DOB: August 12, 1960, 56 y.o.   MRN: [id number]     Subjective:  I acted as a Education administrator for Dr. Carollee Herter.  [name], [place]   Patient ID: Tanya Nelson, female    DOB: 05/14/1960, 56 y.o.   MRN: [id number]  Chief Complaint  Patient presents with  . Back Pain    Back Pain  This is a new problem. Episode onset: two weeks ago. Episode frequency: every day in different spots. Pain location: mid back. Radiates to: left shoulder, armpit, and arm. Pertinent negatives include no abdominal pain, bladder incontinence, bowel incontinence, chest pain, dysuria, fever, headaches, leg pain, numbness, paresis, paresthesias, pelvic pain, perianal numbness, tingling, weakness or weight loss. (Upper sternum pain ) She has tried analgesics (Tylenol) for the symptoms.   pt started about 2 weeks ago but moves around.  Mostly in L shoulder , chest , L shoulder blade No sob, no palpitations.    Pt c/o inc stress/ anxiety and thinks this may be playing a part in the stress / muscle spasm.     Patient Care Team: Carollee Herter, Alferd Apa, DO as PCP - General   Past Medical History:  Diagnosis Date  . Acute lymphadenitis   . Cancer (HCC)    Basal Cell--Face  . Heart murmur     Past Surgical History:  Procedure Laterality Date  . ABDOMINAL HYSTERECTOMY    . MOHS SURGERY  08/2008   [facility]    Family History  Problem Relation Age of Onset  . Prostate cancer Father   . Hypertension Mother        Brain aneurysm  . Aneurysm Mother   . Hypertension Unknown   . Breast cancer Neg Hx     Social History   Social History  . Marital status: Married    Spouse name: N/A  . Number of children: N/A  . Years of education: N/A   Occupational History  . [employer]   Social History Main Topics  . Smoking status: Never Smoker  . Smokeless tobacco: Never Used  . Alcohol use Yes     Comment: rare  . Drug use: No  . Sexual activity: Yes    Partners: Male   Other Topics  Concern  . Not on file   Social History Narrative   Exercise-no    Outpatient Medications Prior to Visit  Medication Sig Dispense Refill  . aspirin EC 81 MG EC tablet Take 81 mg by mouth daily.      Marland Kitchen estradiol (CLIMARA - DOSED IN MG/24 HR) 0.05 mg/24hr patch APPLY 1 PATCH EXTERNALLY TO THE SKIN EVERY WEEK 12 patch 0  . Multiple Vitamin (MULTIVITAMIN) tablet Take 1 tablet by mouth daily.      . pravastatin (PRAVACHOL) 40 MG tablet TAKE 1 TABLET BY MOUTH DAILY 90 tablet 0  . QNASL 80 MCG/ACT AERS INSTILL 2 SPRAYS IN EACH NOSTRIL EVERY DAY 8.7 g 1   No facility-administered medications prior to visit.     No Known Allergies  Review of Systems  Constitutional: Negative for fever, malaise/fatigue and weight loss.  HENT: Negative for congestion.   Eyes: Negative for blurred vision.  Respiratory: Negative for cough and shortness of breath.   Cardiovascular: Negative for chest pain, palpitations and leg swelling.  Gastrointestinal: Negative for abdominal pain, bowel incontinence and vomiting.  Genitourinary: Negative for bladder incontinence, dysuria and pelvic pain.  Musculoskeletal: Positive for back pain.  Skin: Negative for rash.  Neurological: Negative for tingling, loss of consciousness, weakness, numbness, headaches and paresthesias.       Objective:    Physical Exam  Constitutional: She is oriented to person, place, and time. She appears well-developed and well-nourished.  HENT:  Head: Normocephalic and atraumatic.  Eyes: Conjunctivae and EOM are normal.  Neck: Normal range of motion. Neck supple. No JVD present. Carotid bruit is not present. No thyromegaly present.  Cardiovascular: Normal rate, regular rhythm and normal heart sounds.   No murmur heard. Pulmonary/Chest: Effort normal and breath sounds normal. No respiratory distress. She has no wheezes. She has no rales. She exhibits no tenderness.  Musculoskeletal: She exhibits tenderness. She exhibits no edema.        Arms: Neurological: She is alert and oriented to person, place, and time.  Psychiatric: She has a normal mood and affect.  Nursing note and vitals reviewed.   BP 122/80   Pulse 72   Temp 98.2 F (36.8 C) (Oral)   Resp 16   Ht 5\' 8"  (1.727 m)   Wt 162 lb 3.2 oz (73.6 kg)   SpO2 98%   BMI 24.66 kg/m  Wt Readings from Last 3 Encounters:  02/19/17 162 lb 3.2 oz (73.6 kg)  07/24/16 160 lb (72.6 kg)  09/03/15 152 lb (68.9 kg)   BP Readings from Last 3 Encounters:  02/19/17 122/80  07/24/16 104/62  09/03/15 124/60     Immunization History  Administered Date(s) Administered  . Hepatitis A 11/28/2009, 07/03/2010  . Influenza Whole 03/04/2006, 03/05/2007, 02/09/2008, 01/18/2009, 11/28/2009  . Influenza,inj,Quad PF,6+ Mos 12/31/2012  . Influenza-Unspecified 01/08/2014  . Td 04/22/1999, 04/24/2009  . Typhoid Parenteral 11/28/2009    Health Maintenance  Topic Date Due  . PAP SMEAR  09/03/2018  . MAMMOGRAM  02/12/2019  . TETANUS/TDAP  04/25/2019  . COLONOSCOPY  06/07/2020  . INFLUENZA VACCINE  Completed  . Hepatitis C Screening  Completed  . HIV Screening  Completed    Lab Results  Component Value Date   WBC 4.5 07/24/2016   HGB 14.4 07/24/2016   HCT 42.2 07/24/2016   PLT 218.0 07/24/2016   GLUCOSE 96 07/24/2016   CHOL 167 07/24/2016   TRIG 72.0 07/24/2016   HDL 56.50 07/24/2016   LDLDIRECT 158.7 03/31/2008   LDLCALC 96 07/24/2016   ALT 20 07/24/2016   AST 24 07/24/2016   NA 140 07/24/2016   K 4.4 07/24/2016   CL 103 07/24/2016   CREATININE 0.97 07/24/2016   BUN 15 07/24/2016   CO2 32 07/24/2016   TSH 0.878 07/24/2016    Lab Results  Component Value Date   TSH 0.878 07/24/2016   Lab Results  Component Value Date   WBC 4.5 07/24/2016   HGB 14.4 07/24/2016   HCT 42.2 07/24/2016   MCV 90.7 07/24/2016   PLT 218.0 07/24/2016   Lab Results  Component Value Date   NA 140 07/24/2016   K 4.4 07/24/2016   CO2 32 07/24/2016   GLUCOSE 96 07/24/2016   BUN  15 07/24/2016   CREATININE 0.97 07/24/2016   BILITOT 0.4 07/24/2016   ALKPHOS 50 07/24/2016   AST 24 07/24/2016   ALT 20 07/24/2016   PROT 7.8 07/24/2016   ALBUMIN 4.4 07/24/2016   CALCIUM 9.8 07/24/2016   GFR 62.73 07/24/2016   Lab Results  Component Value Date   CHOL 167 07/24/2016   Lab Results  Component Value Date   HDL 56.50 07/24/2016   Lab Results  Component Value Date  Dorris 96 07/24/2016   Lab Results  Component Value Date   TRIG 72.0 07/24/2016   Lab Results  Component Value Date   CHOLHDL 3 07/24/2016   No results found for: HGBA1C       Assessment & Plan:   Problem List Items Addressed This Visit    None    Visit Diagnoses    Chest pain, unspecified type    -  Primary   Relevant Orders   EKG 12-Lead (Completed)   Muscle spasm       Relevant Medications   cyclobenzaprine (FLEXERIL) 10 MG tablet   Anxiety       Relevant Medications   escitalopram (LEXAPRO) 10 MG tablet      I have discontinued Ms. Fae Blossom. I am also having her start on cyclobenzaprine and escitalopram. Additionally, I am having her maintain her aspirin EC, multivitamin, pravastatin, and estradiol.  Meds ordered this encounter  Medications  . cyclobenzaprine (FLEXERIL) 10 MG tablet    Sig: Take 1 tablet (10 mg total) by mouth 3 (three) times daily as needed for muscle spasms.    Dispense:  30 tablet    Refill:  0  . escitalopram (LEXAPRO) 10 MG tablet    Sig: Take 1 tablet (10 mg total) by mouth daily.    Dispense:  30 tablet    Refill:  2    CMA served as scribe during this visit. History, Physical and Plan performed by medical provider. Documentation and orders reviewed and attested to.  Ann Held, DO

## 2017-02-19 NOTE — Patient Instructions (Signed)
Chest Wall Pain °Chest wall pain is pain in or around the bones and muscles of your chest. Sometimes, an injury causes this pain. Sometimes, the cause may not be known. This pain may take several weeks or longer to get better. °Follow these instructions at home: °Pay attention to any changes in your symptoms. Take these actions to help with your pain: °· Rest as told by your health care provider. °· Avoid activities that cause pain. These include any activities that use your chest muscles or your abdominal and side muscles to lift heavy items. °· If directed, apply ice to the painful area: °¨ Put ice in a plastic bag. °¨ Place a towel between your skin and the bag. °¨ Leave the ice on for 20 minutes, 2-3 times per day. °· Take over-the-counter and prescription medicines only as told by your health care provider. °· Do not use tobacco products, including cigarettes, chewing tobacco, and e-cigarettes. If you need help quitting, ask your health care provider. °· Keep all follow-up visits as told by your health care provider. This is important. °Contact a health care provider if: °· You have a fever. °· Your chest pain becomes worse. °· You have new symptoms. °Get help right away if: °· You have nausea or vomiting. °· You feel sweaty or light-headed. °· You have a cough with phlegm (sputum) or you cough up blood. °· You develop shortness of breath. °This information is not intended to replace advice given to you by your health care provider. Make sure you discuss any questions you have with your health care provider. °Document Released: 04/07/2005 Document Revised: 08/16/2015 Document Reviewed: 07/03/2014 °Elsevier Interactive Patient Education © 2017 [employer] Inc. ° °

## 2017-03-23 ENCOUNTER — Ambulatory Visit: Payer: BLUE CROSS/BLUE SHIELD | Admitting: Family Medicine

## 2017-03-23 ENCOUNTER — Encounter: Payer: Self-pay | Admitting: Family Medicine

## 2017-03-23 VITALS — BP 133/80 | HR 68 | Temp 98.2°F | Resp 16 | Ht 68.0 in | Wt 164.0 lb

## 2017-03-23 DIAGNOSIS — M25512 Pain in left shoulder: Secondary | ICD-10-CM | POA: Diagnosis not present

## 2017-03-23 DIAGNOSIS — E782 Mixed hyperlipidemia: Secondary | ICD-10-CM

## 2017-03-23 DIAGNOSIS — E785 Hyperlipidemia, unspecified: Secondary | ICD-10-CM

## 2017-03-23 NOTE — Assessment & Plan Note (Signed)
Tolerating statin, encouraged heart healthy diet, avoid trans fats, minimize simple carbs and saturated fats. Increase exercise as tolerated 

## 2017-03-23 NOTE — Assessment & Plan Note (Signed)
+   muscle spasms--  Improved width flexeril Pt has noticed that when she is stressed at work the pain is worse Pt is almost completely resolved no rto prn

## 2017-03-23 NOTE — Patient Instructions (Signed)

## 2017-03-23 NOTE — Progress Notes (Signed)
Patient ID: Tanya Nelson, female   DOB: March 07, 1961, 56 y.o.   MRN: [id number]     Subjective:  I acted as a Education administrator for Dr. Carollee Herter.  [name], [name]    Patient ID: Tanya Nelson, female    DOB: 07-Jun-1960, 56 y.o.   MRN: [id number]  Chief Complaint  Patient presents with  . Follow-up    left arm pain    HPI  Patient is in today for follow up left arm pain.  Pain is much better from one month ago. No complaints Pt is here to repeat labs also.   Patient Care Team: Carollee Herter, Alferd Apa, DO as PCP - General   Past Medical History:  Diagnosis Date  . Acute lymphadenitis   . Cancer (HCC)    Basal Cell--Face  . Heart murmur     Past Surgical History:  Procedure Laterality Date  . ABDOMINAL HYSTERECTOMY    . MOHS SURGERY  08/2008   [facility]    Family History  Problem Relation Age of Onset  . Prostate cancer Father   . Hypertension Mother        Brain aneurysm  . Aneurysm Mother   . Hypertension Unknown   . Breast cancer Neg Hx     Social History   Socioeconomic History  . Marital status: Married    Spouse name: Not on file  . Number of children: Not on file  . Years of education: Not on file  . Highest education level: Not on file  Social Needs  . Financial resource strain: Not on file  . Food insecurity - worry: Not on file  . Food insecurity - inability: Not on file  . Transportation needs - medical: Not on file  . Transportation needs - non-medical: Not on file  Occupational History  . Occupation: [employer]    Employer: [employer]  Tobacco Use  . Smoking status: Never Smoker  . Smokeless tobacco: Never Used  Substance and Sexual Activity  . Alcohol use: Yes    Comment: rare  . Drug use: No  . Sexual activity: Yes    Partners: Male  Other Topics Concern  . Not on file  Social History Narrative   Exercise-no    Outpatient Medications Prior to Visit  Medication Sig Dispense Refill  . aspirin EC 81 MG EC tablet Take 81 mg by mouth daily.      .  cyclobenzaprine (FLEXERIL) 10 MG tablet Take 1 tablet (10 mg total) by mouth 3 (three) times daily as needed for muscle spasms. 30 tablet 0  . escitalopram (LEXAPRO) 10 MG tablet Take 1 tablet (10 mg total) by mouth daily. 30 tablet 2  . estradiol (CLIMARA - DOSED IN MG/24 HR) 0.05 mg/24hr patch APPLY 1 PATCH EXTERNALLY TO THE SKIN EVERY WEEK 12 patch 0  . Multiple Vitamin (MULTIVITAMIN) tablet Take 1 tablet by mouth daily.      . pravastatin (PRAVACHOL) 40 MG tablet TAKE 1 TABLET BY MOUTH DAILY 90 tablet 0   No facility-administered medications prior to visit.     No Known Allergies  Review of Systems  Constitutional: Negative for fever and malaise/fatigue.  HENT: Negative for congestion.   Eyes: Negative for blurred vision.  Respiratory: Negative for cough and shortness of breath.   Cardiovascular: Negative for chest pain, palpitations and leg swelling.  Gastrointestinal: Negative for vomiting.  Musculoskeletal: Negative for back pain.  Skin: Negative for rash.  Neurological: Negative for loss of consciousness and headaches.  Objective:    Physical Exam  Constitutional: She is oriented to person, place, and time. She appears well-developed and well-nourished.  HENT:  Head: Normocephalic and atraumatic.  Eyes: Conjunctivae and EOM are normal.  Neck: Normal range of motion. Neck supple. No JVD present. Carotid bruit is not present. No thyromegaly present.  Cardiovascular: Normal rate, regular rhythm and normal heart sounds.  No murmur heard. Pulmonary/Chest: Effort normal and breath sounds normal. No respiratory distress. She has no wheezes. She has no rales. She exhibits no tenderness.  Musculoskeletal: She exhibits tenderness. She exhibits no edema.  Neurological: She is alert and oriented to person, place, and time.  Psychiatric: She has a normal mood and affect.  Nursing note and vitals reviewed.   BP 133/80 (BP Location: Right Arm, Cuff Size: Normal)   Pulse 68    Temp 98.2 F (36.8 C) (Oral)   Resp 16   Ht 5\' 8"  (1.727 m)   Wt 164 lb (74.4 kg)   SpO2 97%   BMI 24.94 kg/m  Wt Readings from Last 3 Encounters:  03/23/17 164 lb (74.4 kg)  02/19/17 162 lb 3.2 oz (73.6 kg)  07/24/16 160 lb (72.6 kg)   BP Readings from Last 3 Encounters:  03/23/17 133/80  02/19/17 122/80  07/24/16 104/62     Immunization History  Administered Date(s) Administered  . Hepatitis A 11/28/2009, 07/03/2010  . Influenza Whole 03/04/2006, 03/05/2007, 02/09/2008, 01/18/2009, 11/28/2009  . Influenza,inj,Quad PF,6+ Mos 12/31/2012  . Influenza-Unspecified 01/08/2014  . Td 04/22/1999, 04/24/2009  . Typhoid Parenteral 11/28/2009    Health Maintenance  Topic Date Due  . PAP SMEAR  09/03/2018  . MAMMOGRAM  02/12/2019  . TETANUS/TDAP  04/25/2019  . COLONOSCOPY  06/07/2020  . INFLUENZA VACCINE  Completed  . Hepatitis C Screening  Completed  . HIV Screening  Completed    Lab Results  Component Value Date   WBC 4.5 07/24/2016   HGB 14.4 07/24/2016   HCT 42.2 07/24/2016   PLT 218.0 07/24/2016   GLUCOSE 96 07/24/2016   CHOL 167 07/24/2016   TRIG 72.0 07/24/2016   HDL 56.50 07/24/2016   LDLDIRECT 158.7 03/31/2008   LDLCALC 96 07/24/2016   ALT 20 07/24/2016   AST 24 07/24/2016   NA 140 07/24/2016   K 4.4 07/24/2016   CL 103 07/24/2016   CREATININE 0.97 07/24/2016   BUN 15 07/24/2016   CO2 32 07/24/2016   TSH 0.878 07/24/2016    Lab Results  Component Value Date   TSH 0.878 07/24/2016   Lab Results  Component Value Date   WBC 4.5 07/24/2016   HGB 14.4 07/24/2016   HCT 42.2 07/24/2016   MCV 90.7 07/24/2016   PLT 218.0 07/24/2016   Lab Results  Component Value Date   NA 140 07/24/2016   K 4.4 07/24/2016   CO2 32 07/24/2016   GLUCOSE 96 07/24/2016   BUN 15 07/24/2016   CREATININE 0.97 07/24/2016   BILITOT 0.4 07/24/2016   ALKPHOS 50 07/24/2016   AST 24 07/24/2016   ALT 20 07/24/2016   PROT 7.8 07/24/2016   ALBUMIN 4.4 07/24/2016   CALCIUM  9.8 07/24/2016   GFR 62.73 07/24/2016   Lab Results  Component Value Date   CHOL 167 07/24/2016   Lab Results  Component Value Date   HDL 56.50 07/24/2016   Lab Results  Component Value Date   LDLCALC 96 07/24/2016   Lab Results  Component Value Date   TRIG 72.0 07/24/2016   Lab  Results  Component Value Date   CHOLHDL 3 07/24/2016   No results found for: HGBA1C       Assessment & Plan:   Problem List Items Addressed This Visit      Unprioritized   Hyperlipidemia    Tolerating statin, encouraged heart healthy diet, avoid trans fats, minimize simple carbs and saturated fats. Increase exercise as tolerated      Left shoulder pain    + muscle spasms--  Improved width flexeril Pt has noticed that when she is stressed at work the pain is worse Pt is almost completely resolved no rto prn        Other Visit Diagnoses    Hyperlipidemia LDL goal <100    -  Primary   Relevant Orders   Lipid panel   Comprehensive metabolic panel      I am having Chyrel Masson maintain her aspirin EC, multivitamin, pravastatin, estradiol, cyclobenzaprine, and escitalopram.  No orders of the defined types were placed in this encounter.   CMA served as Education administrator during this visit. History, Physical and Plan performed by medical provider. Documentation and orders reviewed and attested to.  Ann Held, DO

## 2017-03-24 LAB — COMPREHENSIVE METABOLIC PANEL
ALK PHOS: 47 U/L (ref 39–117)
ALT: 19 U/L (ref 0–35)
AST: 21 U/L (ref 0–37)
Albumin: 4.5 g/dL (ref 3.5–5.2)
BUN: 18 mg/dL (ref 6–23)
CHLORIDE: 101 meq/L (ref 96–112)
CO2: 31 meq/L (ref 19–32)
Calcium: 9.6 mg/dL (ref 8.4–10.5)
Creatinine, Ser: 1 mg/dL (ref 0.40–1.20)
GFR: 60.42 mL/min (ref >60.00)
GLUCOSE: 96 mg/dL (ref 70–99)
POTASSIUM: 5.1 meq/L (ref 3.5–5.1)
SODIUM: 137 meq/L (ref 135–145)
Total Bilirubin: 0.4 mg/dL (ref 0.2–1.2)
Total Protein: 7.6 g/dL (ref 6.0–8.3)

## 2017-03-24 LAB — LIPID PANEL
CHOL/HDL RATIO: 3
Cholesterol: 168 mg/dL (ref 0–200)
HDL: 54.3 mg/dL (ref >39.00)
LDL Cholesterol: 85 mg/dL (ref 0–99)
NonHDL: 114.14
Triglycerides: 148 mg/dL (ref 0.0–149.0)
VLDL: 29.6 mg/dL (ref 0.0–40.0)

## 2017-03-30 ENCOUNTER — Other Ambulatory Visit: Payer: Self-pay | Admitting: Family Medicine

## 2017-06-22 ENCOUNTER — Other Ambulatory Visit: Payer: Self-pay | Admitting: Family Medicine

## 2017-06-27 ENCOUNTER — Other Ambulatory Visit: Payer: Self-pay | Admitting: Family Medicine

## 2017-09-14 ENCOUNTER — Other Ambulatory Visit: Payer: Self-pay | Admitting: Family Medicine

## 2017-09-24 ENCOUNTER — Other Ambulatory Visit: Payer: Self-pay | Admitting: Family Medicine

## 2017-12-06 ENCOUNTER — Other Ambulatory Visit: Payer: Self-pay | Admitting: Family Medicine

## 2017-12-07 NOTE — Telephone Encounter (Signed)
Estradiol refill sent to pharmacy. Pt was due for a 6 month follow up with Dr Carollee Herter in June and is past due. Please call pt to schedule appt soon. Thanks!

## 2017-12-07 NOTE — Telephone Encounter (Signed)
appt scheduled for 12/17/17

## 2017-12-17 ENCOUNTER — Ambulatory Visit (INDEPENDENT_AMBULATORY_CARE_PROVIDER_SITE_OTHER): Payer: BLUE CROSS/BLUE SHIELD | Admitting: Family Medicine

## 2017-12-17 ENCOUNTER — Encounter: Payer: Self-pay | Admitting: Family Medicine

## 2017-12-17 VITALS — BP 123/71 | HR 69 | Temp 98.3°F | Resp 16 | Ht 68.0 in | Wt 161.4 lb

## 2017-12-17 DIAGNOSIS — E7849 Other hyperlipidemia: Secondary | ICD-10-CM | POA: Diagnosis not present

## 2017-12-17 LAB — COMPREHENSIVE METABOLIC PANEL
ALK PHOS: 43 U/L (ref 39–117)
ALT: 16 U/L (ref 0–35)
AST: 21 U/L (ref 0–37)
Albumin: 4.3 g/dL (ref 3.5–5.2)
BILIRUBIN TOTAL: 0.6 mg/dL (ref 0.2–1.2)
BUN: 17 mg/dL (ref 6–23)
CO2: 32 mEq/L (ref 19–32)
Calcium: 9.4 mg/dL (ref 8.4–10.5)
Chloride: 103 mEq/L (ref 96–112)
Creatinine, Ser: 1.04 mg/dL (ref 0.40–1.20)
GFR: 57.6 mL/min — ABNORMAL LOW (ref >60.00)
GLUCOSE: 89 mg/dL (ref 70–99)
Potassium: 4.2 mEq/L (ref 3.5–5.1)
SODIUM: 139 meq/L (ref 135–145)
TOTAL PROTEIN: 7.2 g/dL (ref 6.0–8.3)

## 2017-12-17 LAB — LIPID PANEL
Cholesterol: 136 mg/dL (ref 0–200)
HDL: 48.6 mg/dL (ref >39.00)
LDL Cholesterol: 72 mg/dL (ref 0–99)
NONHDL: 87.34
Total CHOL/HDL Ratio: 3
Triglycerides: 79 mg/dL (ref 0.0–149.0)
VLDL: 15.8 mg/dL (ref 0.0–40.0)

## 2017-12-17 NOTE — Patient Instructions (Signed)

## 2017-12-17 NOTE — Progress Notes (Signed)
Patient ID: Tanya Nelson, female    DOB: April 18, 1961  Age: 57 y.o. MRN: [id number]    Subjective:  Subjective  HPI Tanya Nelson presents for f/u cholesterol    No complaints.  L arm pain has resolved.  She was able to get a desk that goes up and down so she can stand  Review of Systems  Constitutional: Negative for appetite change, diaphoresis, fatigue and unexpected weight change.  Eyes: Negative for pain, redness and visual disturbance.  Respiratory: Negative for cough, chest tightness, shortness of breath and wheezing.   Cardiovascular: Negative for chest pain, palpitations and leg swelling.  Endocrine: Negative for cold intolerance, heat intolerance, polydipsia, polyphagia and polyuria.  Genitourinary: Negative for difficulty urinating, dysuria and frequency.  Neurological: Negative for dizziness, light-headedness, numbness and headaches.    History Past Medical History:  Diagnosis Date  . Acute lymphadenitis   . Cancer (HCC)    Basal Cell--Face  . Heart murmur     She has a past surgical history that includes Abdominal hysterectomy and Mohs surgery (08/2008).   Her family history includes Aneurysm in her mother; Hypertension in her mother and unknown relative; Prostate cancer in her father.She reports that she has never smoked. She has never used smokeless tobacco. She reports that she drinks alcohol. She reports that she does not use drugs.  Current Outpatient Medications on File Prior to Visit  Medication Sig Dispense Refill  . aspirin EC 81 MG EC tablet Take 81 mg by mouth daily.      . cyclobenzaprine (FLEXERIL) 10 MG tablet Take 1 tablet (10 mg total) by mouth 3 (three) times daily as needed for muscle spasms. 30 tablet 0  . estradiol (CLIMARA - DOSED IN MG/24 HR) 0.05 mg/24hr patch APPLY 1 PATCH EXTERNALLY TO THE SKIN EVERY WEEK 12 patch 0  . Multiple Vitamin (MULTIVITAMIN) tablet Take 1 tablet by mouth daily.      . pravastatin (PRAVACHOL) 40 MG tablet TAKE 1  TABLET BY MOUTH DAILY 90 tablet 0   No current facility-administered medications on file prior to visit.      Objective:  Objective  Physical Exam  Constitutional: She is oriented to person, place, and time. She appears well-developed and well-nourished. No distress.  HENT:  Head: Normocephalic and atraumatic.  Right Ear: External ear normal.  Left Ear: External ear normal.  Nose: Nose normal.  Mouth/Throat: Oropharynx is clear and moist.  Eyes: Pupils are equal, round, and reactive to light. Conjunctivae and EOM are normal.  Neck: Normal range of motion. Neck supple. No JVD present. Carotid bruit is not present. No thyromegaly present.  Cardiovascular: Normal rate, regular rhythm and normal heart sounds.  No murmur heard. Pulmonary/Chest: Effort normal and breath sounds normal. No respiratory distress. She has no wheezes. She has no rales. She exhibits no tenderness.  Musculoskeletal: She exhibits no edema.  Neurological: She is alert and oriented to person, place, and time.  Psychiatric: She has a normal mood and affect. Her behavior is normal. Judgment and thought content normal.  Nursing note and vitals reviewed.  BP 123/71 (BP Location: Right Arm, Cuff Size: Normal)   Pulse 69   Temp 98.3 F (36.8 C) (Oral)   Resp 16   Ht 5\' 8"  (1.727 m)   Wt 161 lb 6.4 oz (73.2 kg)   SpO2 99%   BMI 24.54 kg/m  Wt Readings from Last 3 Encounters:  12/17/17 161 lb 6.4 oz (73.2 kg)  03/23/17 164 lb (74.4  kg)  02/19/17 162 lb 3.2 oz (73.6 kg)     Lab Results  Component Value Date   WBC 4.5 07/24/2016   HGB 14.4 07/24/2016   HCT 42.2 07/24/2016   PLT 218.0 07/24/2016   GLUCOSE 96 03/23/2017   CHOL 168 03/23/2017   TRIG 148.0 03/23/2017   HDL 54.30 03/23/2017   LDLDIRECT 158.7 03/31/2008   LDLCALC 85 03/23/2017   ALT 19 03/23/2017   AST 21 03/23/2017   NA 137 03/23/2017   K 5.1 03/23/2017   CL 101 03/23/2017   CREATININE 1.00 03/23/2017   BUN 18 03/23/2017   CO2 31  03/23/2017   TSH 0.878 07/24/2016    Mm Screening Breast Tomo Bilateral  Result Date: 02/11/2017 CLINICAL DATA:  Screening. EXAM: 2D DIGITAL SCREENING BILATERAL MAMMOGRAM WITH CAD AND ADJUNCT TOMO COMPARISON:  Previous exam(s). ACR Breast Density Category b: There are scattered areas of fibroglandular density. FINDINGS: There are no findings suspicious for malignancy. Images were processed with CAD. IMPRESSION: No mammographic evidence of malignancy. A result letter of this screening mammogram will be mailed directly to the patient. RECOMMENDATION: Screening mammogram in one year. (Code:SM-B-01Y) BI-RADS CATEGORY  1: Negative. Electronically Signed   By: Lajean Manes M.D.   On: 02/11/2017 16:28     Assessment & Plan:  Plan  I have discontinued Tanya Bump. Nelson's escitalopram. I am also having her maintain her aspirin EC, multivitamin, cyclobenzaprine, pravastatin, and estradiol.  No orders of the defined types were placed in this encounter.   Problem List Items Addressed This Visit      Unprioritized   Hyperlipidemia - Primary   Relevant Orders   Comprehensive metabolic panel   Lipid panel    Encouraged heart healthy diet, increase exercise, avoid trans fats, consider a krill oil cap daily Tolerating statin   Follow-up: Return in about 6 months (around 06/19/2018), or if symptoms worsen or fail to improve, for annual exam, fasting.  Ann Held, DO

## 2017-12-23 ENCOUNTER — Other Ambulatory Visit: Payer: Self-pay | Admitting: Family Medicine

## 2018-01-20 ENCOUNTER — Other Ambulatory Visit: Payer: Self-pay | Admitting: Family Medicine

## 2018-01-20 DIAGNOSIS — Z1231 Encounter for screening mammogram for malignant neoplasm of breast: Secondary | ICD-10-CM

## 2018-01-26 ENCOUNTER — Encounter: Payer: Self-pay | Admitting: Family Medicine

## 2018-02-25 ENCOUNTER — Ambulatory Visit
Admission: RE | Admit: 2018-02-25 | Discharge: 2018-02-25 | Disposition: A | Discharge status: Home / Self Care | Payer: BLUE CROSS/BLUE SHIELD | Source: Ambulatory Visit | Attending: Family Medicine | Admitting: Family Medicine

## 2018-02-25 DIAGNOSIS — Z1231 Encounter for screening mammogram for malignant neoplasm of breast: Secondary | ICD-10-CM

## 2018-02-28 ENCOUNTER — Other Ambulatory Visit: Payer: Self-pay | Admitting: Family Medicine

## 2018-03-23 ENCOUNTER — Other Ambulatory Visit: Payer: Self-pay | Admitting: Family Medicine

## 2018-06-24 ENCOUNTER — Ambulatory Visit (INDEPENDENT_AMBULATORY_CARE_PROVIDER_SITE_OTHER): Payer: BLUE CROSS/BLUE SHIELD | Admitting: Family Medicine

## 2018-06-24 ENCOUNTER — Other Ambulatory Visit (HOSPITAL_COMMUNITY)
Admission: RE | Admit: 2018-06-24 | Discharge: 2018-06-24 | Disposition: A | Discharge status: Home / Self Care | Payer: BLUE CROSS/BLUE SHIELD | Source: Ambulatory Visit | Attending: Family Medicine | Admitting: Family Medicine

## 2018-06-24 ENCOUNTER — Encounter: Payer: Self-pay | Admitting: Family Medicine

## 2018-06-24 VITALS — BP 122/78 | HR 68 | Temp 97.6°F | Ht 67.0 in | Wt 155.0 lb

## 2018-06-24 DIAGNOSIS — E785 Hyperlipidemia, unspecified: Secondary | ICD-10-CM

## 2018-06-24 DIAGNOSIS — Z1211 Encounter for screening for malignant neoplasm of colon: Secondary | ICD-10-CM

## 2018-06-24 DIAGNOSIS — Z0000 Encounter for general adult medical examination without abnormal findings: Secondary | ICD-10-CM | POA: Diagnosis not present

## 2018-06-24 DIAGNOSIS — Z23 Encounter for immunization: Secondary | ICD-10-CM

## 2018-06-24 DIAGNOSIS — C44310 Basal cell carcinoma of skin of unspecified parts of face: Secondary | ICD-10-CM | POA: Diagnosis not present

## 2018-06-24 LAB — COMPREHENSIVE METABOLIC PANEL
ALT: 18 U/L (ref 0–35)
AST: 23 U/L (ref 0–37)
Albumin: 4.4 g/dL (ref 3.5–5.2)
Alkaline Phosphatase: 50 U/L (ref 39–117)
BUN: 12 mg/dL (ref 6–23)
CO2: 32 mEq/L (ref 19–32)
Calcium: 9.5 mg/dL (ref 8.4–10.5)
Chloride: 103 mEq/L (ref 96–112)
Creatinine, Ser: 0.97 mg/dL (ref 0.40–1.20)
GFR: 58.63 mL/min — ABNORMAL LOW (ref >60.00)
Glucose, Bld: 93 mg/dL (ref 70–99)
Potassium: 4 mEq/L (ref 3.5–5.1)
Sodium: 139 mEq/L (ref 135–145)
Total Bilirubin: 0.5 mg/dL (ref 0.2–1.2)
Total Protein: 7.2 g/dL (ref 6.0–8.3)

## 2018-06-24 LAB — LIPID PANEL
Cholesterol: 152 mg/dL (ref 0–200)
HDL: 52.8 mg/dL (ref >39.00)
LDL Cholesterol: 74 mg/dL (ref 0–99)
NONHDL: 99.6
TRIGLYCERIDES: 127 mg/dL (ref 0.0–149.0)
Total CHOL/HDL Ratio: 3
VLDL: 25.4 mg/dL (ref 0.0–40.0)

## 2018-06-24 LAB — CBC WITH DIFFERENTIAL/PLATELET
Basophils Absolute: 0 10*3/uL (ref 0.0–0.1)
Basophils Relative: 0.6 % (ref 0.0–3.0)
Eosinophils Absolute: 0.1 10*3/uL (ref 0.0–0.7)
Eosinophils Relative: 1.3 % (ref 0.0–5.0)
HCT: 42.5 % (ref 36.0–46.0)
HEMOGLOBIN: 14.5 g/dL (ref 12.0–15.0)
Lymphocytes Relative: 28.2 % (ref 12.0–46.0)
Lymphs Abs: 1.4 10*3/uL (ref 0.7–4.0)
MCHC: 34.1 g/dL (ref 30.0–36.0)
MCV: 90.6 fl (ref 78.0–100.0)
Monocytes Absolute: 0.4 10*3/uL (ref 0.1–1.0)
Monocytes Relative: 9.1 % (ref 3.0–12.0)
Neutro Abs: 3 10*3/uL (ref 1.4–7.7)
Neutrophils Relative %: 60.8 % (ref 43.0–77.0)
Platelets: 201 10*3/uL (ref 150.0–400.0)
RBC: 4.69 Mil/uL (ref 3.87–5.11)
RDW: 13.1 % (ref 11.5–15.5)
WBC: 4.9 10*3/uL (ref 4.0–10.5)

## 2018-06-24 LAB — TSH: TSH: 0.61 u[IU]/mL (ref 0.35–4.50)

## 2018-06-24 MED ORDER — PRAVASTATIN SODIUM 40 MG PO TABS: 40.0000 mg | ORAL_TABLET | Freq: Every day | ORAL | 1 refills | Status: DC

## 2018-06-24 NOTE — Progress Notes (Signed)
+ Subjective:      Tanya Nelson is a 58 y.o. female and is here for a comprehensive physical exam. The patient reports no problems. We will also check labs and refill meds.    Social History   Socioeconomic History  . Marital status: Married    Spouse name: Not on file  . Number of children: Not on file  . Years of education: Not on file  . Highest education level: Not on file  Occupational History  . Occupation: [employer]    Employer: [employer]  Social Needs  . Financial resource strain: Not on file  . Food insecurity:    Worry: Not on file    Inability: Not on file  . Transportation needs:    Medical: Not on file    Non-medical: Not on file  Tobacco Use  . Smoking status: Never Smoker  . Smokeless tobacco: Never Used  Substance and Sexual Activity  . Alcohol use: Yes    Comment: rare  . Drug use: No  . Sexual activity: Yes    Partners: Male  Lifestyle  . Physical activity:    Days per week: Not on file    Minutes per session: Not on file  . Stress: Not on file  Relationships  . Social connections:    Talks on phone: Not on file    Gets together: Not on file    Attends religious service: Not on file    Active member of club or organization: Not on file    Attends meetings of clubs or organizations: Not on file    Relationship status: Not on file  . Intimate partner violence:    Fear of current or ex partner: Not on file    Emotionally abused: Not on file    Physically abused: Not on file    Forced sexual activity: Not on file  Other Topics Concern  . Not on file  Social History Narrative   Exercise-no   Health Maintenance  Topic Date Due  . PAP SMEAR-Modifier  09/03/2018  . TETANUS/TDAP  04/25/2019  . MAMMOGRAM  02/26/2020  . COLONOSCOPY  06/07/2020  . INFLUENZA VACCINE  Completed  . Hepatitis C Screening  Completed  . HIV Screening  Completed    The following portions of the patient's history were reviewed and updated as appropriate:  She   has a past medical history of Acute lymphadenitis, Cancer ([place]), and Heart murmur. She does not have any pertinent problems on file. She  has a past surgical history that includes Abdominal hysterectomy and Mohs surgery (08/2008). Her family history includes Aneurysm in her mother; Hypertension in her mother and another family member; Prostate cancer in her father. She  reports that she has never smoked. She has never used smokeless tobacco. She reports current alcohol use. She reports that she does not use drugs. She has a current medication list which includes the following prescription(s): aspirin ec, estradiol, multivitamin, and pravastatin. Current Outpatient Medications on File Prior to Visit  Medication Sig Dispense Refill  . aspirin EC 81 MG EC tablet Take 81 mg by mouth daily.      Marland Kitchen estradiol (CLIMARA - DOSED IN MG/24 HR) 0.05 mg/24hr patch APPLY 1 PATCH EXTERNALLY TO THE SKIN EVERY WEEK 12 patch 1  . Multiple Vitamin (MULTIVITAMIN) tablet Take 1 tablet by mouth daily.       No current facility-administered medications on file prior to visit.    She has No Known Allergies.Marland Kitchen  Review of Systems Review of Systems  Constitutional: Negative for activity change, appetite change and fatigue.  HENT: Negative for hearing loss, congestion, tinnitus and ear discharge.  dentist q33m Eyes: Negative for visual disturbance (see optho q1y -- vision corrected to 20/20 with glasses).  Respiratory: Negative for cough, chest tightness and shortness of breath.   Cardiovascular: Negative for chest pain, palpitations and leg swelling.  Gastrointestinal: Negative for abdominal pain, diarrhea, constipation and abdominal distention.  Genitourinary: Negative for urgency, frequency, decreased urine volume and difficulty urinating.  Musculoskeletal: Negative for back pain, arthralgias and gait problem.  Skin: Negative for color change, pallor and rash.  Neurological: Negative for dizziness, light-headedness,  numbness and headaches.  Hematological: Negative for adenopathy. Does not bruise/bleed easily.  Psychiatric/Behavioral: Negative for suicidal ideas, confusion, sleep disturbance, self-injury, dysphoric mood, decreased concentration and agitation.     Objective:    BP 122/78   Pulse 68   Temp 97.6 F (36.4 C)   Ht 5\' 7"  (1.702 m)   Wt 155 lb (70.3 kg)   SpO2 98%   BMI 24.28 kg/m  General appearance: alert, cooperative, appears stated age and no distress Head: Normocephalic, without obvious abnormality, atraumatic Eyes: conjunctivae/corneas clear. PERRL, EOM's intact. Fundi benign. Ears: normal TM's and external ear canals both ears Nose: Nares normal. Septum midline. Mucosa normal. No drainage or sinus tenderness. Throat: lips, mucosa, and tongue normal; teeth and gums normal Neck: no adenopathy, no carotid bruit, no JVD, supple, symmetrical, trachea midline and thyroid not enlarged, symmetric, no tenderness/mass/nodules Back: symmetric, no curvature. ROM normal. No CVA tenderness. Lungs: clear to auscultation bilaterally Breasts: normal appearance, no masses or tenderness Heart: regular rate and rhythm, S1, S2 normal, no murmur, click, rub or gallop Abdomen: soft, non-tender; bowel sounds normal; no masses,  no organomegaly Pelvic: cervix normal in appearance, external genitalia normal, no adnexal masses or tenderness, no cervical motion tenderness, rectovaginal septum normal, uterus normal size, shape, and consistency, vagina normal without discharge and pap done, rectal heme neg brown stool Extremities: extremities normal, atraumatic, no cyanosis or edema Pulses: 2+ and symmetric Skin: Skin color, texture, turgor normal. No rashes or lesions Lymph nodes: Cervical, supraclavicular, and axillary nodes normal. Neurologic: Alert and oriented X 3, normal strength and tone. Normal symmetric reflexes. Normal coordination and gait    Assessment:    Healthy female exam.      Plan:      ghm utd Check labs  See After Visit Summary for Counseling Recommendations    1. Preventative health care See above - Lipid panel - CBC with Differential/Platelet - TSH - Comprehensive metabolic panel - Cytology - PAP  2. Hyperlipidemia, unspecified hyperlipidemia type Tolerating statin, encouraged heart healthy diet, avoid trans fats, minimize simple carbs and saturated fats. Increase exercise as tolerated - pravastatin (PRAVACHOL) 40 MG tablet; Take 1 tablet (40 mg total) by mouth daily.  Dispense: 90 tablet; Refill: 1 - Lipid panel - Comprehensive metabolic panel - Lipid panel; Future - Comprehensive metabolic panel; Future  3. Colon cancer screening  - Fecal occult blood, imunochemical; Future  4. Basal cell carcinoma, face con't derm f/u

## 2018-06-24 NOTE — Patient Instructions (Signed)
Preventive Care 40-64 Years, Female Preventive care refers to lifestyle choices and visits with your health care provider that can promote health and wellness. What does preventive care include?   A yearly physical exam. This is also called an annual well check.  Dental exams once or twice a year.  Routine eye exams. Ask your health care provider how often you should have your eyes checked.  Personal lifestyle choices, including: ? Daily care of your teeth and gums. ? Regular physical activity. ? Eating a healthy diet. ? Avoiding tobacco and drug use. ? Limiting alcohol use. ? Practicing safe sex. ? Taking low-dose aspirin daily starting at age 50. ? Taking vitamin and mineral supplements as recommended by your health care provider. What happens during an annual well check? The services and screenings done by your health care provider during your annual well check will depend on your age, overall health, lifestyle risk factors, and family history of disease. Counseling Your health care provider may ask you questions about your:  Alcohol use.  Tobacco use.  Drug use.  Emotional well-being.  Home and relationship well-being.  Sexual activity.  Eating habits.  Work and work environment.  Method of birth control.  Menstrual cycle.  Pregnancy history. Screening You may have the following tests or measurements:  Height, weight, and BMI.  Blood pressure.  Lipid and cholesterol levels. These may be checked every 5 years, or more frequently if you are over 50 years old.  Skin check.  Lung cancer screening. You may have this screening every year starting at age 55 if you have a 30-pack-year history of smoking and currently smoke or have quit within the past 15 years.  Colorectal cancer screening. All adults should have this screening starting at age 50 and continuing until age 75. Your health care provider may recommend screening at age 45. You will have tests every  1-10 years, depending on your results and the type of screening test. People at increased risk should start screening at an earlier age. Screening tests may include: ? Guaiac-based fecal occult blood testing. ? Fecal immunochemical test (FIT). ? Stool DNA test. ? Virtual colonoscopy. ? Sigmoidoscopy. During this test, a flexible tube with a tiny camera (sigmoidoscope) is used to examine your rectum and lower colon. The sigmoidoscope is inserted through your anus into your rectum and lower colon. ? Colonoscopy. During this test, a long, thin, flexible tube with a tiny camera (colonoscope) is used to examine your entire colon and rectum.  Hepatitis C blood test.  Hepatitis B blood test.  Sexually transmitted disease (STD) testing.  Diabetes screening. This is done by checking your blood sugar (glucose) after you have not eaten for a while (fasting). You may have this done every 1-3 years.  Mammogram. This may be done every 1-2 years. Talk to your health care provider about when you should start having regular mammograms. This may depend on whether you have a family history of breast cancer.  BRCA-related cancer screening. This may be done if you have a family history of breast, ovarian, tubal, or peritoneal cancers.  Pelvic exam and Pap test. This may be done every 3 years starting at age 21. Starting at age 30, this may be done every 5 years if you have a Pap test in combination with an HPV test.  Bone density scan. This is done to screen for osteoporosis. You may have this scan if you are at high risk for osteoporosis. Discuss your test results, treatment options,   and if necessary, the need for more tests with your health care provider. Vaccines Your health care provider may recommend certain vaccines, such as:  Influenza vaccine. This is recommended every year.  Tetanus, diphtheria, and acellular pertussis (Tdap, Td) vaccine. You may need a Td booster every 10 years.  Varicella  vaccine. You may need this if you have not been vaccinated.  Zoster vaccine. You may need this after age 38.  Measles, mumps, and rubella (MMR) vaccine. You may need at least one dose of MMR if you were born in 1957 or later. You may also need a second dose.  Pneumococcal 13-valent conjugate (PCV13) vaccine. You may need this if you have certain conditions and were not previously vaccinated.  Pneumococcal polysaccharide (PPSV23) vaccine. You may need one or two doses if you smoke cigarettes or if you have certain conditions.  Meningococcal vaccine. You may need this if you have certain conditions.  Hepatitis A vaccine. You may need this if you have certain conditions or if you travel or work in places where you may be exposed to hepatitis A.  Hepatitis B vaccine. You may need this if you have certain conditions or if you travel or work in places where you may be exposed to hepatitis B.  Haemophilus influenzae type b (Hib) vaccine. You may need this if you have certain conditions. Talk to your health care provider about which screenings and vaccines you need and how often you need them. This information is not intended to replace advice given to you by your health care provider. Make sure you discuss any questions you have with your health care provider. Document Released: 05/04/2015 Document Revised: 05/28/2017 Document Reviewed: 02/06/2015 Elsevier Interactive Patient Education  2019 [employer].

## 2018-06-25 LAB — CYTOLOGY - PAP
Diagnosis: NEGATIVE
HPV: NOT DETECTED

## 2018-06-26 NOTE — Assessment & Plan Note (Signed)
Tolerating statin, encouraged heart healthy diet, avoid trans fats, minimize simple carbs and saturated fats. Increase exercise as tolerated 

## 2018-06-26 NOTE — Assessment & Plan Note (Signed)
con't with derm f/u

## 2018-06-28 ENCOUNTER — Other Ambulatory Visit: Payer: Self-pay | Admitting: Family Medicine

## 2018-06-28 DIAGNOSIS — Z0000 Encounter for general adult medical examination without abnormal findings: Secondary | ICD-10-CM | POA: Diagnosis not present

## 2018-06-28 DIAGNOSIS — E785 Hyperlipidemia, unspecified: Secondary | ICD-10-CM

## 2018-06-28 DIAGNOSIS — Z23 Encounter for immunization: Secondary | ICD-10-CM | POA: Diagnosis not present

## 2018-06-28 NOTE — Addendum Note (Signed)
Addended by: Elta Guadeloupe on: 06/28/2018 11:40 AM   Modules accepted: Orders

## 2018-07-12 ENCOUNTER — Other Ambulatory Visit (INDEPENDENT_AMBULATORY_CARE_PROVIDER_SITE_OTHER): Payer: BLUE CROSS/BLUE SHIELD

## 2018-07-12 DIAGNOSIS — Z1211 Encounter for screening for malignant neoplasm of colon: Secondary | ICD-10-CM | POA: Diagnosis not present

## 2018-07-12 LAB — FECAL OCCULT BLOOD, IMMUNOCHEMICAL: Fecal Occult Bld: NEGATIVE

## 2018-08-15 ENCOUNTER — Other Ambulatory Visit: Payer: Self-pay | Admitting: Family Medicine

## 2018-08-17 ENCOUNTER — Encounter: Payer: Self-pay | Admitting: Family Medicine

## 2018-08-25 ENCOUNTER — Ambulatory Visit: Payer: BLUE CROSS/BLUE SHIELD

## 2018-09-28 ENCOUNTER — Other Ambulatory Visit: Payer: Self-pay

## 2018-09-28 ENCOUNTER — Ambulatory Visit (INDEPENDENT_AMBULATORY_CARE_PROVIDER_SITE_OTHER): Payer: BC Managed Care – PPO

## 2018-09-28 DIAGNOSIS — Z23 Encounter for immunization: Secondary | ICD-10-CM | POA: Diagnosis not present

## 2018-09-29 NOTE — Progress Notes (Signed)
Pre visit review using our clinic review tool, if applicable. No additional management support is needed unless otherwise documented below in the visit note.  Patient is here today for shingrix vaccine. 0.65ml given in right deltoid IM. Patient tolerated well.

## 2018-09-30 ENCOUNTER — Encounter: Payer: Self-pay | Admitting: Family Medicine

## 2018-12-21 ENCOUNTER — Other Ambulatory Visit: Payer: Self-pay | Admitting: Family Medicine

## 2018-12-21 DIAGNOSIS — E785 Hyperlipidemia, unspecified: Secondary | ICD-10-CM

## 2018-12-29 ENCOUNTER — Other Ambulatory Visit: Payer: Self-pay

## 2018-12-30 ENCOUNTER — Ambulatory Visit: Payer: BC Managed Care – PPO | Admitting: Family Medicine

## 2018-12-30 ENCOUNTER — Encounter: Payer: Self-pay | Admitting: Family Medicine

## 2018-12-30 VITALS — BP 120/78 | HR 69 | Temp 97.7°F | Resp 18 | Ht 67.0 in | Wt 161.2 lb

## 2018-12-30 DIAGNOSIS — Z23 Encounter for immunization: Secondary | ICD-10-CM

## 2018-12-30 DIAGNOSIS — E785 Hyperlipidemia, unspecified: Secondary | ICD-10-CM

## 2018-12-30 LAB — COMPREHENSIVE METABOLIC PANEL
ALT: 15 U/L (ref 0–35)
AST: 18 U/L (ref 0–37)
Albumin: 4.2 g/dL (ref 3.5–5.2)
Alkaline Phosphatase: 49 U/L (ref 39–117)
BUN: 15 mg/dL (ref 6–23)
CO2: 31 mEq/L (ref 19–32)
Calcium: 9.4 mg/dL (ref 8.4–10.5)
Chloride: 103 mEq/L (ref 96–112)
Creatinine, Ser: 0.98 mg/dL (ref 0.40–1.20)
GFR: 57.84 mL/min — ABNORMAL LOW (ref >60.00)
Glucose, Bld: 87 mg/dL (ref 70–99)
Potassium: 4.1 mEq/L (ref 3.5–5.1)
Sodium: 139 mEq/L (ref 135–145)
Total Bilirubin: 0.6 mg/dL (ref 0.2–1.2)
Total Protein: 7.2 g/dL (ref 6.0–8.3)

## 2018-12-30 LAB — LIPID PANEL
Cholesterol: 132 mg/dL (ref 0–200)
HDL: 45.1 mg/dL (ref >39.00)
LDL Cholesterol: 72 mg/dL (ref 0–99)
NonHDL: 87.28
Total CHOL/HDL Ratio: 3
Triglycerides: 76 mg/dL (ref 0.0–149.0)
VLDL: 15.2 mg/dL (ref 0.0–40.0)

## 2018-12-30 NOTE — Progress Notes (Signed)
Patient ID: VERDIE URIAS, female    DOB: 03-17-1961  Age: 58 y.o. MRN: [id number]    Subjective:  Subjective  HPI MAKENZYE ENGLEHART presents for f/u chol.   Pt is with no complaints.    Review of Systems  Constitutional: Negative for appetite change, diaphoresis, fatigue and unexpected weight change.  Eyes: Negative for pain, redness and visual disturbance.  Respiratory: Negative for cough, chest tightness, shortness of breath and wheezing.   Cardiovascular: Negative for chest pain, palpitations and leg swelling.  Endocrine: Negative for cold intolerance, heat intolerance, polydipsia, polyphagia and polyuria.  Genitourinary: Negative for difficulty urinating, dysuria and frequency.  Neurological: Negative for dizziness, light-headedness, numbness and headaches.    History Past Medical History:  Diagnosis Date  . Acute lymphadenitis   . Cancer (HCC)    Basal Cell--Face  . Heart murmur     She has a past surgical history that includes Abdominal hysterectomy and Mohs surgery (08/2008).   Her family history includes Aneurysm in her mother; Hypertension in her mother and another family member; Prostate cancer in her father.She reports that she has never smoked. She has never used smokeless tobacco. She reports current alcohol use. She reports that she does not use drugs.  Current Outpatient Medications on File Prior to Visit  Medication Sig Dispense Refill  . aspirin EC 81 MG EC tablet Take 81 mg by mouth daily.      Marland Kitchen estradiol (CLIMARA - DOSED IN MG/24 HR) 0.05 mg/24hr patch APPLY 1 PATCH EXTERNALLY TO THE SKIN EVERY WEEK 12 patch 1  . Multiple Vitamin (MULTIVITAMIN) tablet Take 1 tablet by mouth daily.      . pravastatin (PRAVACHOL) 40 MG tablet TAKE 1 TABLET(40 MG) BY MOUTH DAILY 90 tablet 1   No current facility-administered medications on file prior to visit.      Objective:  Objective  Physical Exam Vitals signs and nursing note reviewed.  Constitutional:    Appearance: She is well-developed.  HENT:     Head: Normocephalic and atraumatic.  Eyes:     Conjunctiva/sclera: Conjunctivae normal.  Neck:     Musculoskeletal: Normal range of motion and neck supple.     Thyroid: No thyromegaly.     Vascular: No carotid bruit or JVD.  Cardiovascular:     Rate and Rhythm: Normal rate and regular rhythm.     Heart sounds: Normal heart sounds. No murmur.  Pulmonary:     Effort: Pulmonary effort is normal. No respiratory distress.     Breath sounds: Normal breath sounds. No wheezing or rales.  Chest:     Chest wall: No tenderness.  Neurological:     Mental Status: She is alert and oriented to person, place, and time.    BP 120/78 (BP Location: Left Arm, Patient Position: Sitting, Cuff Size: Normal)   Pulse 69   Temp 97.7 F (36.5 C) (Temporal)   Resp 18   Ht 5\' 7"  (1.702 m)   Wt 161 lb 3.2 oz (73.1 kg)   SpO2 97%   BMI 25.25 kg/m  Wt Readings from Last 3 Encounters:  12/30/18 161 lb 3.2 oz (73.1 kg)  06/24/18 155 lb (70.3 kg)  12/17/17 161 lb 6.4 oz (73.2 kg)     Lab Results  Component Value Date   WBC 4.9 06/24/2018   HGB 14.5 06/24/2018   HCT 42.5 06/24/2018   PLT 201.0 06/24/2018   GLUCOSE 93 06/24/2018   CHOL 152 06/24/2018   TRIG 127.0 06/24/2018  HDL 52.80 06/24/2018   LDLDIRECT 158.7 03/31/2008   LDLCALC 74 06/24/2018   ALT 18 06/24/2018   AST 23 06/24/2018   NA 139 06/24/2018   K 4.0 06/24/2018   CL 103 06/24/2018   CREATININE 0.97 06/24/2018   BUN 12 06/24/2018   CO2 32 06/24/2018   TSH 0.61 06/24/2018    No results found.   Assessment & Plan:  Plan  I am having Chyrel Masson maintain her aspirin EC, multivitamin, estradiol, and pravastatin.  No orders of the defined types were placed in this encounter.   Problem List Items Addressed This Visit      Unprioritized   Hyperlipidemia    Tolerating statin, encouraged heart healthy diet, avoid trans fats, minimize simple carbs and saturated fats. Increase  exercise as tolerated      Relevant Orders   Lipid panel   Comprehensive metabolic panel    Other Visit Diagnoses    Need for influenza vaccination    -  Primary   Relevant Orders   Flu Vaccine QUAD 6+ mos PF IM (Fluarix Quad PF) (Completed)      Follow-up: Return in about 6 months (around 06/29/2019), or if symptoms worsen or fail to improve, for annual exam, fasting.  Ann Held, DO

## 2018-12-30 NOTE — Assessment & Plan Note (Signed)
Tolerating statin, encouraged heart healthy diet, avoid trans fats, minimize simple carbs and saturated fats. Increase exercise as tolerated 

## 2019-01-25 ENCOUNTER — Other Ambulatory Visit: Payer: Self-pay | Admitting: *Deleted

## 2019-01-25 MED ORDER — ESTRADIOL 0.05 MG/24HR TD PTWK: MEDICATED_PATCH | TRANSDERMAL | 1 refills | Status: DC

## 2019-06-23 ENCOUNTER — Encounter: Payer: Self-pay | Admitting: Family Medicine

## 2019-06-23 DIAGNOSIS — E785 Hyperlipidemia, unspecified: Secondary | ICD-10-CM

## 2019-06-24 MED ORDER — PRAVASTATIN SODIUM 40 MG PO TABS: ORAL_TABLET | ORAL | 1 refills | Status: DC

## 2019-06-30 ENCOUNTER — Other Ambulatory Visit: Payer: Self-pay

## 2019-07-01 ENCOUNTER — Other Ambulatory Visit: Payer: Self-pay | Admitting: Family Medicine

## 2019-07-01 ENCOUNTER — Encounter: Payer: Self-pay | Admitting: Family Medicine

## 2019-07-01 ENCOUNTER — Other Ambulatory Visit: Payer: Self-pay

## 2019-07-01 ENCOUNTER — Ambulatory Visit (INDEPENDENT_AMBULATORY_CARE_PROVIDER_SITE_OTHER): Payer: BC Managed Care – PPO | Admitting: Family Medicine

## 2019-07-01 VITALS — BP 120/80 | HR 66 | Temp 97.4°F | Resp 18 | Ht 67.0 in | Wt 164.6 lb

## 2019-07-01 DIAGNOSIS — Z23 Encounter for immunization: Secondary | ICD-10-CM | POA: Diagnosis not present

## 2019-07-01 DIAGNOSIS — Z0000 Encounter for general adult medical examination without abnormal findings: Secondary | ICD-10-CM | POA: Diagnosis not present

## 2019-07-01 DIAGNOSIS — E2839 Other primary ovarian failure: Secondary | ICD-10-CM | POA: Diagnosis not present

## 2019-07-01 DIAGNOSIS — Z1231 Encounter for screening mammogram for malignant neoplasm of breast: Secondary | ICD-10-CM

## 2019-07-01 DIAGNOSIS — E785 Hyperlipidemia, unspecified: Secondary | ICD-10-CM

## 2019-07-01 LAB — CBC WITH DIFFERENTIAL/PLATELET
Basophils Absolute: 0 10*3/uL (ref 0.0–0.1)
Basophils Relative: 0.6 % (ref 0.0–3.0)
Eosinophils Absolute: 0.1 10*3/uL (ref 0.0–0.7)
Eosinophils Relative: 1.9 % (ref 0.0–5.0)
HCT: 41.7 % (ref 36.0–46.0)
Hemoglobin: 14.5 g/dL (ref 12.0–15.0)
Lymphocytes Relative: 34.5 % (ref 12.0–46.0)
Lymphs Abs: 1.6 10*3/uL (ref 0.7–4.0)
MCHC: 34.8 g/dL (ref 30.0–36.0)
MCV: 91.6 fl (ref 78.0–100.0)
Monocytes Absolute: 0.5 10*3/uL (ref 0.1–1.0)
Monocytes Relative: 11.6 % (ref 3.0–12.0)
Neutro Abs: 2.4 10*3/uL (ref 1.4–7.7)
Neutrophils Relative %: 51.4 % (ref 43.0–77.0)
Platelets: 201 10*3/uL (ref 150.0–400.0)
RBC: 4.55 Mil/uL (ref 3.87–5.11)
RDW: 12.4 % (ref 11.5–15.5)
WBC: 4.6 10*3/uL (ref 4.0–10.5)

## 2019-07-01 LAB — COMPREHENSIVE METABOLIC PANEL
ALT: 16 U/L (ref 0–35)
AST: 18 U/L (ref 0–37)
Albumin: 4.3 g/dL (ref 3.5–5.2)
Alkaline Phosphatase: 49 U/L (ref 39–117)
BUN: 17 mg/dL (ref 6–23)
CO2: 30 mEq/L (ref 19–32)
Calcium: 9.5 mg/dL (ref 8.4–10.5)
Chloride: 101 mEq/L (ref 96–112)
Creatinine, Ser: 0.97 mg/dL (ref 0.40–1.20)
GFR: 58.43 mL/min — ABNORMAL LOW (ref >60.00)
Glucose, Bld: 89 mg/dL (ref 70–99)
Potassium: 4 mEq/L (ref 3.5–5.1)
Sodium: 137 mEq/L (ref 135–145)
Total Bilirubin: 0.5 mg/dL (ref 0.2–1.2)
Total Protein: 7.4 g/dL (ref 6.0–8.3)

## 2019-07-01 LAB — LIPID PANEL
Cholesterol: 147 mg/dL (ref 0–200)
HDL: 45 mg/dL (ref >39.00)
LDL Cholesterol: 83 mg/dL (ref 0–99)
NonHDL: 101.74
Total CHOL/HDL Ratio: 3
Triglycerides: 95 mg/dL (ref 0.0–149.0)
VLDL: 19 mg/dL (ref 0.0–40.0)

## 2019-07-01 LAB — TSH: TSH: 0.81 u[IU]/mL (ref 0.35–4.50)

## 2019-07-01 NOTE — Patient Instructions (Signed)

## 2019-07-01 NOTE — Progress Notes (Signed)
Subjective:     Tanya Nelson is a 59 y.o. female and is here for a comprehensive physical exam. The patient reports no problems.  Social History   Socioeconomic History  . Marital status: Married    Spouse name: Not on file  . Number of children: Not on file  . Years of education: Not on file  . Highest education level: Not on file  Occupational History  . Occupation: [employer]    Employer: [employer]  Tobacco Use  . Smoking status: Never Smoker  . Smokeless tobacco: Never Used  Substance and Sexual Activity  . Alcohol use: Yes    Comment: rare  . Drug use: No  . Sexual activity: Yes    Partners: Male  Other Topics Concern  . Not on file  Social History Narrative   Exercise-no   Social Determinants of Health   Financial Resource Strain:   . Difficulty of Paying Living Expenses:   Food Insecurity:   . Worried About Charity fundraiser in the Last Year:   . Arboriculturist in the Last Year:   Transportation Needs:   . Film/video editor (Medical):   Marland Kitchen Lack of Transportation (Non-Medical):   Physical Activity:   . Days of Exercise per Week:   . Minutes of Exercise per Session:   Stress:   . Feeling of Stress :   Social Connections:   . Frequency of Communication with Friends and Family:   . Frequency of Social Gatherings with Friends and Family:   . Attends Religious Services:   . Active Member of Clubs or Organizations:   . Attends Archivist Meetings:   Marland Kitchen Marital Status:   Intimate Partner Violence:   . Fear of Current or Ex-Partner:   . Emotionally Abused:   Marland Kitchen Physically Abused:   . Sexually Abused:    Health Maintenance  Topic Date Due  . MAMMOGRAM  02/26/2019  . TETANUS/TDAP  04/25/2019  . COLONOSCOPY  06/07/2020  . PAP SMEAR-Modifier  06/23/2021  . INFLUENZA VACCINE  Completed  . Hepatitis C Screening  Completed  . HIV Screening  Completed    The following portions of the patient's history were reviewed and updated as  appropriate:  She  has a past medical history of Acute lymphadenitis, Cancer ([place]), and Heart murmur. She does not have any pertinent problems on file. She  has a past surgical history that includes Abdominal hysterectomy and Mohs surgery (08/2008). Her family history includes Aneurysm in her mother; Hypertension in her mother and another family member; Prostate cancer in her father. She  reports that she has never smoked. She has never used smokeless tobacco. She reports current alcohol use. She reports that she does not use drugs. She has a current medication list which includes the following prescription(s): aspirin ec, estradiol, multivitamin, and pravastatin. Current Outpatient Medications on File Prior to Visit  Medication Sig Dispense Refill  . aspirin EC 81 MG EC tablet Take 81 mg by mouth daily.      Marland Kitchen estradiol (CLIMARA - DOSED IN MG/24 HR) 0.05 mg/24hr patch Apply 1 patch externally to the skin every week 12 patch 1  . Multiple Vitamin (MULTIVITAMIN) tablet Take 1 tablet by mouth daily.      . pravastatin (PRAVACHOL) 40 MG tablet TAKE 1 TABLET(40 MG) BY MOUTH DAILY 90 tablet 1   No current facility-administered medications on file prior to visit.   She has No Known Allergies..  Review of  Systems Review of Systems  Constitutional: Negative for activity change, appetite change and fatigue.  HENT: Negative for hearing loss, congestion, tinnitus and ear discharge.  dentist q5m Eyes: Negative for visual disturbance (see optho q1y -- vision corrected to 20/20 with glasses).  Respiratory: Negative for cough, chest tightness and shortness of breath.   Cardiovascular: Negative for chest pain, palpitations and leg swelling.  Gastrointestinal: Negative for abdominal pain, diarrhea, constipation and abdominal distention.  Genitourinary: Negative for urgency, frequency, decreased urine volume and difficulty urinating.  Musculoskeletal: Negative for back pain, arthralgias and gait problem.   Skin: Negative for color change, pallor and rash.  Neurological: Negative for dizziness, light-headedness, numbness and headaches.  Hematological: Negative for adenopathy. Does not bruise/bleed easily.  Psychiatric/Behavioral: Negative for suicidal ideas, confusion, sleep disturbance, self-injury, dysphoric mood, decreased concentration and agitation.      Objective:    BP 120/80 (BP Location: Left Arm, Patient Position: Sitting, Cuff Size: Normal)   Pulse 66   Temp (!) 97.4 F (36.3 C) (Temporal)   Resp 18   Ht 5\' 7"  (1.702 m)   Wt 164 lb 9.6 oz (74.7 kg)   SpO2 99%   BMI 25.78 kg/m  General appearance: alert, cooperative, appears stated age and no distress Head: Normocephalic, without obvious abnormality, atraumatic Eyes: negative findings: lids and lashes normal, conjunctivae and sclerae normal and pupils equal, round, reactive to light and accomodation Ears: normal TM's and external ear canals both ears Nose: Nares normal. Septum midline. Mucosa normal. No drainage or sinus tenderness. Throat: lips, mucosa, and tongue normal; teeth and gums normal Neck: no adenopathy, no carotid bruit, no JVD, supple, symmetrical, trachea midline and thyroid not enlarged, symmetric, no tenderness/mass/nodules Back: symmetric, no curvature. ROM normal. No CVA tenderness. Lungs: clear to auscultation bilaterally Breasts: normal appearance, no masses or tenderness Heart: regular rate and rhythm, S1, S2 normal, no murmur, click, rub or gallop Abdomen: soft, non-tender; bowel sounds normal; no masses,  no organomegaly Pelvic: not indicated; status post hysterectomy, negative ROS Extremities: extremities normal, atraumatic, no cyanosis or edema Pulses: 2+ and symmetric Skin: Skin color, texture, turgor normal. No rashes or lesions Lymph nodes: Cervical, supraclavicular, and axillary nodes normal. Neurologic: Alert and oriented X 3, normal strength and tone. Normal symmetric reflexes. Normal  coordination and gait    Assessment:    Healthy female exam.      Plan:    ghm utd Check labs  See After Visit Summary for Counseling Recommendations   She will schedule mammogram / bmd 1. Preventative health care See above  - CBC with Differential/Platelet - Lipid panel - TSH - Comprehensive metabolic panel  2. Hyperlipidemia, unspecified hyperlipidemia type Encouraged heart healthy diet, increase exercise, avoid trans fats, consider a krill oil cap daily - CBC with Differential/Platelet - Lipid panel - TSH - Comprehensive metabolic panel  3. Need for tetanus booster   - Tdap vaccine greater than or equal to 7yo IM  4. Estrogen deficiency   - DG Bone Density; Future

## 2019-07-06 ENCOUNTER — Encounter: Payer: Self-pay | Admitting: Family Medicine

## 2019-07-18 ENCOUNTER — Encounter: Payer: Self-pay | Admitting: Family Medicine

## 2019-07-18 MED ORDER — ESTRADIOL 0.05 MG/24HR TD PTWK: MEDICATED_PATCH | TRANSDERMAL | 1 refills | Status: DC

## 2019-07-22 ENCOUNTER — Ambulatory Visit: Payer: BC Managed Care – PPO | Attending: Internal Medicine

## 2019-07-22 DIAGNOSIS — Z23 Encounter for immunization: Secondary | ICD-10-CM

## 2019-07-22 NOTE — Progress Notes (Signed)
   Covid-19 Vaccination Clinic  Name:  Tanya Nelson    MRN: [id number] DOB: 04-26-1960  07/22/2019  [name] was observed post Covid-19 immunization for 15 minutes without incident. She was provided with Vaccine Information Sheet and instruction to access the V-Safe system.   [name] was instructed to call 911 with any severe reactions post vaccine: Marland Kitchen Difficulty breathing  . Swelling of face and throat  . A fast heartbeat  . A bad rash all over body  . Dizziness and weakness   Immunizations Administered    Name Date Dose VIS Date Route   Pfizer COVID-19 Vaccine 07/22/2019  9:42 AM 0.3 mL 04/01/2019 Intramuscular   Manufacturer: [place]   Lot: [id number]   [place]: [id number]

## 2019-08-08 DIAGNOSIS — Z08 Encounter for follow-up examination after completed treatment for malignant neoplasm: Secondary | ICD-10-CM | POA: Diagnosis not present

## 2019-08-08 DIAGNOSIS — L570 Actinic keratosis: Secondary | ICD-10-CM | POA: Diagnosis not present

## 2019-08-08 DIAGNOSIS — C44719 Basal cell carcinoma of skin of left lower limb, including hip: Secondary | ICD-10-CM | POA: Diagnosis not present

## 2019-08-08 DIAGNOSIS — L821 Other seborrheic keratosis: Secondary | ICD-10-CM | POA: Diagnosis not present

## 2019-08-08 DIAGNOSIS — D225 Melanocytic nevi of trunk: Secondary | ICD-10-CM | POA: Diagnosis not present

## 2019-08-08 DIAGNOSIS — Z85828 Personal history of other malignant neoplasm of skin: Secondary | ICD-10-CM | POA: Diagnosis not present

## 2019-08-08 DIAGNOSIS — X32XXXA Exposure to sunlight, initial encounter: Secondary | ICD-10-CM | POA: Diagnosis not present

## 2019-08-15 ENCOUNTER — Ambulatory Visit: Payer: BC Managed Care – PPO | Attending: Internal Medicine

## 2019-08-15 DIAGNOSIS — Z23 Encounter for immunization: Secondary | ICD-10-CM

## 2019-08-15 NOTE — Progress Notes (Signed)
   Covid-19 Vaccination Clinic  Name:  Tanya Nelson    MRN: [id number] DOB: [birth year]  08/15/2019  [name] was observed post Covid-19 immunization for 15 minutes without incident. She was provided with Vaccine Information Sheet and instruction to access the V-Safe system.   [name] was instructed to call 911 with any severe reactions post vaccine: Marland Kitchen Difficulty breathing  . Swelling of face and throat  . A fast heartbeat  . A bad rash all over body  . Dizziness and weakness   Immunizations Administered    Name Date Dose VIS Date Route   Pfizer COVID-19 Vaccine 08/15/2019 12:01 PM 0.3 mL 06/15/2018 Intramuscular   Manufacturer: [place]   Lot: [id number]   [place]: [id number]

## 2019-09-06 ENCOUNTER — Encounter: Payer: Self-pay | Admitting: Family Medicine

## 2019-09-06 NOTE — Telephone Encounter (Signed)
I don't know of a side effect like that to covid vaccine She should be seen

## 2019-09-09 ENCOUNTER — Encounter: Payer: Self-pay | Admitting: Family Medicine

## 2019-09-09 ENCOUNTER — Other Ambulatory Visit: Payer: Self-pay

## 2019-09-09 ENCOUNTER — Ambulatory Visit: Payer: BC Managed Care – PPO | Admitting: Family Medicine

## 2019-09-09 VITALS — BP 108/80 | HR 60 | Temp 97.2°F | Resp 18 | Ht 67.0 in | Wt 163.6 lb

## 2019-09-09 DIAGNOSIS — H6503 Acute serous otitis media, bilateral: Secondary | ICD-10-CM | POA: Diagnosis not present

## 2019-09-09 MED ORDER — OFLOXACIN 0.3 % OT SOLN: 10.0000 [drp] | Freq: Every day | OTIC | 0 refills | Status: DC

## 2019-09-09 MED ORDER — LEVOCETIRIZINE DIHYDROCHLORIDE 5 MG PO TABS: 5.0000 mg | ORAL_TABLET | Freq: Every evening | ORAL | 5 refills | Status: DC

## 2019-09-09 MED ORDER — FLUTICASONE PROPIONATE 50 MCG/ACT NA SUSP: 2.0000 | Freq: Every day | NASAL | 6 refills | Status: DC

## 2019-09-09 NOTE — Patient Instructions (Signed)

## 2019-09-09 NOTE — Progress Notes (Signed)
Patient ID: Tanya Nelson, female    DOB: 04-16-1961  Age: 59 y.o. MRN: [id number]    Subjective:  Subjective  HPI AJNA MATOS presents for ears feeling plugged.   She has been taking chlortrimeton with no relief   No other symptoms   Review of Systems  Constitutional: Negative for appetite change, diaphoresis, fatigue and unexpected weight change.  HENT: Positive for ear pain. Negative for congestion, ear discharge, facial swelling, mouth sores, nosebleeds, postnasal drip, rhinorrhea, sinus pressure, sinus pain, sneezing, sore throat, tinnitus, trouble swallowing and voice change.   Eyes: Negative for pain, redness and visual disturbance.  Respiratory: Negative for cough, chest tightness, shortness of breath and wheezing.   Cardiovascular: Negative for chest pain, palpitations and leg swelling.  Endocrine: Negative for cold intolerance, heat intolerance, polydipsia, polyphagia and polyuria.  Genitourinary: Negative for difficulty urinating, dysuria and frequency.  Neurological: Negative for dizziness, light-headedness, numbness and headaches.    History Past Medical History:  Diagnosis Date  . Acute lymphadenitis   . Cancer (HCC)    Basal Cell--Face  . Heart murmur     She has a past surgical history that includes Abdominal hysterectomy and Mohs surgery (08/2008).   Her family history includes Aneurysm in her mother; Hypertension in her mother and another family member; Prostate cancer in her father.She reports that she has never smoked. She has never used smokeless tobacco. She reports current alcohol use. She reports that she does not use drugs.  Current Outpatient Medications on File Prior to Visit  Medication Sig Dispense Refill  . aspirin EC 81 MG EC tablet Take 81 mg by mouth daily.      Marland Kitchen estradiol (CLIMARA - DOSED IN MG/24 HR) 0.05 mg/24hr patch Apply 1 patch externally to the skin every week 12 patch 1  . Multiple Vitamin (MULTIVITAMIN) tablet Take 1 tablet by mouth  daily.      . pravastatin (PRAVACHOL) 40 MG tablet TAKE 1 TABLET(40 MG) BY MOUTH DAILY 90 tablet 1   No current facility-administered medications on file prior to visit.     Objective:  Objective  Physical Exam Vitals and nursing note reviewed.  Constitutional:      Appearance: She is well-developed.  HENT:     Head: Normocephalic and atraumatic.     Right Ear: Ear canal and external ear normal. A middle ear effusion is present. There is no impacted cerumen. Tympanic membrane is retracted. Tympanic membrane is not injected or bulging. Tympanic membrane has decreased mobility.     Left Ear: Ear canal and external ear normal.  No middle ear effusion. There is no impacted cerumen. Tympanic membrane is bulging.  Eyes:     Conjunctiva/sclera: Conjunctivae normal.  Neck:     Thyroid: No thyromegaly.     Vascular: No carotid bruit or JVD.  Cardiovascular:     Rate and Rhythm: Normal rate and regular rhythm.     Heart sounds: Normal heart sounds. No murmur.  Pulmonary:     Effort: Pulmonary effort is normal. No respiratory distress.     Breath sounds: Normal breath sounds. No wheezing or rales.  Chest:     Chest wall: No tenderness.  Musculoskeletal:     Cervical back: Normal range of motion and neck supple.  Neurological:     Mental Status: She is alert and oriented to person, place, and time.    BP 108/80 (BP Location: Right Arm, Patient Position: Sitting, Cuff Size: Normal)   Pulse 60  Temp (!) 97.2 F (36.2 C) (Temporal)   Resp 18   Ht 5\' 7"  (1.702 m)   Wt 163 lb 9.6 oz (74.2 kg)   SpO2 98%   BMI 25.62 kg/m  Wt Readings from Last 3 Encounters:  09/09/19 163 lb 9.6 oz (74.2 kg)  07/01/19 164 lb 9.6 oz (74.7 kg)  12/30/18 161 lb 3.2 oz (73.1 kg)     Lab Results  Component Value Date   WBC 4.6 07/01/2019   HGB 14.5 07/01/2019   HCT 41.7 07/01/2019   PLT 201.0 07/01/2019   GLUCOSE 89 07/01/2019   CHOL 147 07/01/2019   TRIG 95.0 07/01/2019   HDL 45.00 07/01/2019     LDLDIRECT 158.7 03/31/2008   LDLCALC 83 07/01/2019   ALT 16 07/01/2019   AST 18 07/01/2019   NA 137 07/01/2019   K 4.0 07/01/2019   CL 101 07/01/2019   CREATININE 0.97 07/01/2019   BUN 17 07/01/2019   CO2 30 07/01/2019   TSH 0.81 07/01/2019    No results found.   Assessment & Plan:  Plan  I am having Chyrel Masson start on levocetirizine, fluticasone, and ofloxacin. I am also having her maintain her aspirin EC, multivitamin, pravastatin, and estradiol.  Meds ordered this encounter  Medications  . levocetirizine (XYZAL) 5 MG tablet    Sig: Take 1 tablet (5 mg total) by mouth every evening.    Dispense:  30 tablet    Refill:  5  . fluticasone (FLONASE) 50 MCG/ACT nasal spray    Sig: Place 2 sprays into both nostrils daily.    Dispense:  16 g    Refill:  6  . ofloxacin (FLOXIN OTIC) 0.3 % OTIC solution    Sig: Place 10 drops into the right ear daily.    Dispense:  5 mL    Refill:  0    Problem List Items Addressed This Visit    None    Visit Diagnoses    Non-recurrent acute serous otitis media of both ears    -  Primary   Relevant Medications   levocetirizine (XYZAL) 5 MG tablet   fluticasone (FLONASE) 50 MCG/ACT nasal spray   ofloxacin (FLOXIN OTIC) 0.3 % OTIC solution      Follow-up: Return if symptoms worsen or fail to improve.  Ann Held, DO

## 2019-09-15 ENCOUNTER — Ambulatory Visit: Payer: BC Managed Care – PPO

## 2019-09-15 ENCOUNTER — Ambulatory Visit
Admission: RE | Admit: 2019-09-15 | Discharge: 2019-09-15 | Disposition: A | Discharge status: Home / Self Care | Payer: BC Managed Care – PPO | Source: Ambulatory Visit | Attending: Family Medicine | Admitting: Family Medicine

## 2019-09-15 ENCOUNTER — Other Ambulatory Visit: Payer: Self-pay

## 2019-09-15 DIAGNOSIS — Z13820 Encounter for screening for osteoporosis: Secondary | ICD-10-CM | POA: Diagnosis not present

## 2019-09-15 DIAGNOSIS — Z780 Asymptomatic menopausal state: Secondary | ICD-10-CM | POA: Diagnosis not present

## 2019-09-15 DIAGNOSIS — E2839 Other primary ovarian failure: Secondary | ICD-10-CM

## 2019-10-04 ENCOUNTER — Other Ambulatory Visit: Payer: Self-pay

## 2019-10-04 ENCOUNTER — Ambulatory Visit
Admission: RE | Admit: 2019-10-04 | Discharge: 2019-10-04 | Disposition: A | Discharge status: Home / Self Care | Payer: BC Managed Care – PPO | Source: Ambulatory Visit | Attending: Family Medicine | Admitting: Family Medicine

## 2019-10-04 DIAGNOSIS — Z1231 Encounter for screening mammogram for malignant neoplasm of breast: Secondary | ICD-10-CM | POA: Diagnosis not present

## 2019-10-10 DIAGNOSIS — L570 Actinic keratosis: Secondary | ICD-10-CM | POA: Diagnosis not present

## 2019-10-10 DIAGNOSIS — X32XXXD Exposure to sunlight, subsequent encounter: Secondary | ICD-10-CM | POA: Diagnosis not present

## 2019-12-11 ENCOUNTER — Other Ambulatory Visit: Payer: Self-pay | Admitting: Family Medicine

## 2019-12-11 DIAGNOSIS — E785 Hyperlipidemia, unspecified: Secondary | ICD-10-CM

## 2019-12-21 ENCOUNTER — Other Ambulatory Visit: Payer: Self-pay | Admitting: Family Medicine

## 2020-01-06 ENCOUNTER — Ambulatory Visit: Payer: BC Managed Care – PPO | Admitting: Family Medicine

## 2020-01-26 ENCOUNTER — Other Ambulatory Visit: Payer: Self-pay

## 2020-01-26 ENCOUNTER — Ambulatory Visit: Payer: BC Managed Care – PPO | Admitting: Family Medicine

## 2020-01-26 ENCOUNTER — Encounter: Payer: Self-pay | Admitting: Family Medicine

## 2020-01-26 VITALS — BP 110/76 | HR 63 | Temp 97.6°F | Resp 12 | Ht 67.0 in | Wt 167.8 lb

## 2020-01-26 DIAGNOSIS — Z23 Encounter for immunization: Secondary | ICD-10-CM | POA: Diagnosis not present

## 2020-01-26 DIAGNOSIS — E785 Hyperlipidemia, unspecified: Secondary | ICD-10-CM | POA: Diagnosis not present

## 2020-01-26 NOTE — Progress Notes (Signed)
Patient ID: Tanya Nelson, female    DOB: Jun 23, 1960  Age: 59 y.o. MRN: [id number]    Subjective:  Subjective  HPI Tanya Nelson presents for f/u cholesterol and a flu shot.   No complaints  Review of Systems  Constitutional: Negative for appetite change, diaphoresis, fatigue and unexpected weight change.  Eyes: Negative for pain, redness and visual disturbance.  Respiratory: Negative for cough, chest tightness, shortness of breath and wheezing.   Cardiovascular: Negative for chest pain, palpitations and leg swelling.  Endocrine: Negative for cold intolerance, heat intolerance, polydipsia, polyphagia and polyuria.  Genitourinary: Negative for difficulty urinating, dysuria and frequency.  Neurological: Negative for dizziness, light-headedness, numbness and headaches.    History Past Medical History:  Diagnosis Date  . Acute lymphadenitis   . Cancer (HCC)    Basal Cell--Face  . Heart murmur     She has a past surgical history that includes Abdominal hysterectomy and Mohs surgery (08/2008).   Her family history includes Aneurysm in her mother; Hypertension in her mother and another family member; Prostate cancer in her father.She reports that she has never smoked. She has never used smokeless tobacco. She reports current alcohol use. She reports that she does not use drugs.  Current Outpatient Medications on File Prior to Visit  Medication Sig Dispense Refill  . aspirin EC 81 MG EC tablet Take 81 mg by mouth daily.      Marland Kitchen estradiol (CLIMARA - DOSED IN MG/24 HR) 0.05 mg/24hr patch APPLY 1 PATCH TOPICALLY TO THE SKIN EVERY WEEK 12 patch 1  . levocetirizine (XYZAL) 5 MG tablet Take 1 tablet (5 mg total) by mouth every evening. 30 tablet 5  . Multiple Vitamin (MULTIVITAMIN) tablet Take 1 tablet by mouth daily.      . pravastatin (PRAVACHOL) 40 MG tablet TAKE 1 TABLET(40 MG) BY MOUTH DAILY 90 tablet 1   No current facility-administered medications on file prior to visit.       Objective:  Objective  Physical Exam Vitals and nursing note reviewed.  Constitutional:      Appearance: She is well-developed.  HENT:     Head: Normocephalic and atraumatic.  Eyes:     Conjunctiva/sclera: Conjunctivae normal.  Neck:     Thyroid: No thyromegaly.     Vascular: No carotid bruit or JVD.  Cardiovascular:     Rate and Rhythm: Normal rate and regular rhythm.     Heart sounds: Normal heart sounds. No murmur heard.   Pulmonary:     Effort: Pulmonary effort is normal. No respiratory distress.     Breath sounds: Normal breath sounds. No wheezing or rales.  Chest:     Chest wall: No tenderness.  Musculoskeletal:     Cervical back: Normal range of motion and neck supple.  Neurological:     Mental Status: She is alert and oriented to person, place, and time.    BP 110/76 (BP Location: Right Arm, Cuff Size: Normal)   Pulse 63   Temp 97.6 F (36.4 C) (Oral)   Resp 12   Ht 5\' 7"  (1.702 m)   Wt 167 lb 12.8 oz (76.1 kg)   SpO2 99%   BMI 26.28 kg/m  Wt Readings from Last 3 Encounters:  01/26/20 167 lb 12.8 oz (76.1 kg)  09/09/19 163 lb 9.6 oz (74.2 kg)  07/01/19 164 lb 9.6 oz (74.7 kg)     Lab Results  Component Value Date   WBC 4.6 07/01/2019   HGB 14.5 07/01/2019  HCT 41.7 07/01/2019   PLT 201.0 07/01/2019   GLUCOSE 89 07/01/2019   CHOL 147 07/01/2019   TRIG 95.0 07/01/2019   HDL 45.00 07/01/2019   LDLDIRECT 158.7 03/31/2008   LDLCALC 83 07/01/2019   ALT 16 07/01/2019   AST 18 07/01/2019   NA 137 07/01/2019   K 4.0 07/01/2019   CL 101 07/01/2019   CREATININE 0.97 07/01/2019   BUN 17 07/01/2019   CO2 30 07/01/2019   TSH 0.81 07/01/2019    MM 3D SCREEN BREAST BILATERAL  Result Date: 10/06/2019 CLINICAL DATA:  Screening. EXAM: DIGITAL SCREENING BILATERAL MAMMOGRAM WITH TOMO AND CAD COMPARISON:  Previous exam(s). ACR Breast Density Category c: The breast tissue is heterogeneously dense, which may obscure small masses. FINDINGS: There are no  findings suspicious for malignancy. Images were processed with CAD. IMPRESSION: No mammographic evidence of malignancy. A result letter of this screening mammogram will be mailed directly to the patient. RECOMMENDATION: Screening mammogram in one year. (Code:SM-B-01Y) BI-RADS CATEGORY  1: Negative. Electronically Signed   By: Lovey Newcomer M.D.   On: 10/06/2019 08:26     Assessment & Plan:  Plan  I have discontinued Kamrin Spath. Meno's fluticasone and ofloxacin. I am also having her maintain her aspirin EC, multivitamin, levocetirizine, pravastatin, and estradiol.  No orders of the defined types were placed in this encounter.   Problem List Items Addressed This Visit      Unprioritized   Hyperlipidemia - Primary    Tolerating statin, encouraged heart healthy diet, avoid trans fats, minimize simple carbs and saturated fats. Increase exercise as tolerated con't pravastatin       Relevant Orders   Lipid panel   Comprehensive metabolic panel    Other Visit Diagnoses    Need for influenza vaccination       Influenza vaccine administered       Relevant Orders   Flu Vaccine QUAD 36+ mos IM (Fluarix & Fluzone Quad PF (Completed)      Follow-up: Return in about 6 months (around 07/26/2020) for annual exam, fasting.  Ann Held, DO

## 2020-01-26 NOTE — Patient Instructions (Signed)

## 2020-01-26 NOTE — Assessment & Plan Note (Signed)
Tolerating statin, encouraged heart healthy diet, avoid trans fats, minimize simple carbs and saturated fats. Increase exercise as tolerated con't pravastatin

## 2020-01-27 LAB — COMPREHENSIVE METABOLIC PANEL
AG Ratio: 1.5 (calc) (ref 1.0–2.5)
ALT: 15 U/L (ref 6–29)
AST: 19 U/L (ref 10–35)
Albumin: 4.5 g/dL (ref 3.6–5.1)
Alkaline phosphatase (APISO): 56 U/L (ref 37–153)
BUN/Creatinine Ratio: 15 (calc) (ref 6–22)
BUN: 18 mg/dL (ref 7–25)
CO2: 26 mmol/L (ref 20–32)
Calcium: 9.8 mg/dL (ref 8.6–10.4)
Chloride: 102 mmol/L (ref 98–110)
Creat: 1.19 mg/dL — ABNORMAL HIGH (ref 0.50–0.99)
Globulin: 3 g/dL (calc) (ref 1.9–3.7)
Glucose, Bld: 94 mg/dL (ref 65–99)
Potassium: 4.6 mmol/L (ref 3.5–5.3)
Sodium: 139 mmol/L (ref 135–146)
Total Bilirubin: 0.6 mg/dL (ref 0.2–1.2)
Total Protein: 7.5 g/dL (ref 6.1–8.1)

## 2020-01-27 LAB — LIPID PANEL
Cholesterol: 178 mg/dL (ref <200)
HDL: 47 mg/dL — ABNORMAL LOW (ref >=50)
LDL Cholesterol (Calc): 107 mg/dL (calc) — ABNORMAL HIGH
Non-HDL Cholesterol (Calc): 131 mg/dL (calc) — ABNORMAL HIGH (ref <130)
Total CHOL/HDL Ratio: 3.8 (calc) (ref <5.0)
Triglycerides: 129 mg/dL (ref <150)

## 2020-04-24 ENCOUNTER — Encounter: Payer: Self-pay | Admitting: Family Medicine

## 2020-06-07 ENCOUNTER — Other Ambulatory Visit: Payer: Self-pay | Admitting: Family Medicine

## 2020-06-07 DIAGNOSIS — E785 Hyperlipidemia, unspecified: Secondary | ICD-10-CM

## 2020-07-30 ENCOUNTER — Ambulatory Visit (INDEPENDENT_AMBULATORY_CARE_PROVIDER_SITE_OTHER): Payer: BC Managed Care – PPO | Admitting: Family Medicine

## 2020-07-30 ENCOUNTER — Encounter: Payer: Self-pay | Admitting: Family Medicine

## 2020-07-30 ENCOUNTER — Other Ambulatory Visit: Payer: Self-pay

## 2020-07-30 VITALS — BP 110/70 | HR 69 | Temp 97.9°F | Resp 18 | Ht 67.0 in | Wt 166.2 lb

## 2020-07-30 DIAGNOSIS — Z0001 Encounter for general adult medical examination with abnormal findings: Secondary | ICD-10-CM

## 2020-07-30 DIAGNOSIS — R079 Chest pain, unspecified: Secondary | ICD-10-CM

## 2020-07-30 DIAGNOSIS — C44300 Unspecified malignant neoplasm of skin of unspecified part of face: Secondary | ICD-10-CM

## 2020-07-30 DIAGNOSIS — Z1211 Encounter for screening for malignant neoplasm of colon: Secondary | ICD-10-CM | POA: Diagnosis not present

## 2020-07-30 DIAGNOSIS — H6503 Acute serous otitis media, bilateral: Secondary | ICD-10-CM

## 2020-07-30 DIAGNOSIS — E785 Hyperlipidemia, unspecified: Secondary | ICD-10-CM | POA: Diagnosis not present

## 2020-07-30 DIAGNOSIS — M25512 Pain in left shoulder: Secondary | ICD-10-CM

## 2020-07-30 DIAGNOSIS — Z0000 Encounter for general adult medical examination without abnormal findings: Secondary | ICD-10-CM

## 2020-07-30 LAB — CBC WITH DIFFERENTIAL/PLATELET
Basophils Absolute: 0 10*3/uL (ref 0.0–0.1)
Basophils Relative: 0.6 % (ref 0.0–3.0)
Eosinophils Absolute: 0.1 10*3/uL (ref 0.0–0.7)
Eosinophils Relative: 1.7 % (ref 0.0–5.0)
HCT: 41.2 % (ref 36.0–46.0)
Hemoglobin: 14.1 g/dL (ref 12.0–15.0)
Lymphocytes Relative: 38.5 % (ref 12.0–46.0)
Lymphs Abs: 1.7 10*3/uL (ref 0.7–4.0)
MCHC: 34.2 g/dL (ref 30.0–36.0)
MCV: 91.2 fl (ref 78.0–100.0)
Monocytes Absolute: 0.5 10*3/uL (ref 0.1–1.0)
Monocytes Relative: 11.9 % (ref 3.0–12.0)
Neutro Abs: 2 10*3/uL (ref 1.4–7.7)
Neutrophils Relative %: 47.3 % (ref 43.0–77.0)
Platelets: 180 10*3/uL (ref 150.0–400.0)
RBC: 4.51 Mil/uL (ref 3.87–5.11)
RDW: 12.7 % (ref 11.5–15.5)
WBC: 4.3 10*3/uL (ref 4.0–10.5)

## 2020-07-30 LAB — COMPREHENSIVE METABOLIC PANEL
ALT: 14 U/L (ref 0–35)
AST: 17 U/L (ref 0–37)
Albumin: 4.3 g/dL (ref 3.5–5.2)
Alkaline Phosphatase: 50 U/L (ref 39–117)
BUN: 18 mg/dL (ref 6–23)
CO2: 29 mEq/L (ref 19–32)
Calcium: 9.8 mg/dL (ref 8.4–10.5)
Chloride: 102 mEq/L (ref 96–112)
Creatinine, Ser: 0.99 mg/dL (ref 0.40–1.20)
GFR: 61.4 mL/min (ref >60.00)
Glucose, Bld: 79 mg/dL (ref 70–99)
Potassium: 4.2 mEq/L (ref 3.5–5.1)
Sodium: 139 mEq/L (ref 135–145)
Total Bilirubin: 0.6 mg/dL (ref 0.2–1.2)
Total Protein: 7.2 g/dL (ref 6.0–8.3)

## 2020-07-30 LAB — TSH: TSH: 0.81 u[IU]/mL (ref 0.35–4.50)

## 2020-07-30 LAB — LIPID PANEL
Cholesterol: 130 mg/dL (ref 0–200)
HDL: 46.7 mg/dL (ref >39.00)
LDL Cholesterol: 64 mg/dL (ref 0–99)
NonHDL: 83.42
Total CHOL/HDL Ratio: 3
Triglycerides: 96 mg/dL (ref 0.0–149.0)
VLDL: 19.2 mg/dL (ref 0.0–40.0)

## 2020-07-30 NOTE — Progress Notes (Signed)
Patient ID: Tanya Nelson, female    DOB: 07/18/1960  Age: 60 y.o. MRN: [id number]    Subjective:  Subjective  HPI Tanya Nelson presents for a comprehensive physical examination today. She complains of intermittent left shoulder pain. She notes that pain radiates up to her left breast. She reports that it may be the result of being sedentary and tense. She endorses taking pain killer to relieve the pain. She denies any chest pain, SOB, fever, abdominal pain, cough, chills, sore throat, dysuria, urinary incontinence, back pain, HA, or N/V/D at this time. She states that she is still seeing her dermatologist, dentist, and optometrist. She notes that she is going to get a colonoscopy. She endorses taking 81 mg of Aspirin PO Daily, every other day.     Review of Systems  Constitutional: Negative for chills, fatigue and fever.  HENT: Negative for ear pain, sinus pain and sore throat.   Eyes: Negative for pain.  Respiratory: Negative for cough and shortness of breath.   Cardiovascular: Negative for chest pain, palpitations and leg swelling.  Gastrointestinal: Negative for abdominal pain, blood in stool, constipation, diarrhea, nausea and vomiting.  Genitourinary: Negative for dysuria, frequency, hematuria and urgency.  Musculoskeletal: Negative for back pain.       (+) left shoulder pain  (+) left breast pain secondary to her left shoulder pain   Neurological: Negative for headaches.    History Past Medical History:  Diagnosis Date  . Acute lymphadenitis   . Cancer (HCC)    Basal Cell--Face  . Heart murmur     She has a past surgical history that includes Abdominal hysterectomy and Mohs surgery (08/2008).   Her family history includes Aneurysm in her mother; Hypertension in her mother and another family member; Prostate cancer in her father.She reports that she has never smoked. She has never used smokeless tobacco. She reports current alcohol use. She reports that she does not use  drugs.  Current Outpatient Medications on File Prior to Visit  Medication Sig Dispense Refill  . estradiol (CLIMARA - DOSED IN MG/24 HR) 0.05 mg/24hr patch Place 1 patch (0.05 mg total) onto the skin once a week. 12 patch 1  . Multiple Vitamin (MULTIVITAMIN) tablet Take 1 tablet by mouth daily.    . pravastatin (PRAVACHOL) 40 MG tablet Take 1 tablet (40 mg total) by mouth daily. 90 tablet 1   No current facility-administered medications on file prior to visit.     Objective:  Objective  Physical Exam Vitals and nursing note reviewed.  Constitutional:      General: She is not in acute distress.    Appearance: Normal appearance. She is well-developed. She is not ill-appearing.  HENT:     Head: Normocephalic and atraumatic.     Right Ear: Tympanic membrane, ear canal and external ear normal.     Left Ear: Tympanic membrane, ear canal and external ear normal.     Nose: Nose normal.  Eyes:     Extraocular Movements: Extraocular movements intact.     Pupils: Pupils are equal, round, and reactive to light.  Cardiovascular:     Rate and Rhythm: Normal rate and regular rhythm.     Pulses: Normal pulses.     Heart sounds: Normal heart sounds. No murmur heard. No friction rub. No gallop.   Pulmonary:     Effort: Pulmonary effort is normal. No respiratory distress.     Breath sounds: Normal breath sounds. No wheezing, rhonchi or rales.  Chest:  Chest wall: No tenderness.  Abdominal:     General: Bowel sounds are normal. There is no distension.     Palpations: Abdomen is soft.     Tenderness: There is no abdominal tenderness. There is no guarding or rebound.     Hernia: No hernia is present.  Genitourinary:    Comments: deferred Musculoskeletal:        General: No swelling, tenderness, deformity or signs of injury. Normal range of motion.     Cervical back: Normal range of motion and neck supple.     Right lower leg: No edema.     Left lower leg: No edema.  Skin:    General:  Skin is warm and dry.  Neurological:     General: No focal deficit present.     Mental Status: She is alert and oriented to person, place, and time.     Motor: No weakness.     Coordination: Coordination normal.     Gait: Gait normal.     Deep Tendon Reflexes: Reflexes normal.  Psychiatric:        Mood and Affect: Mood normal.        Behavior: Behavior normal.        Thought Content: Thought content normal.    BP 110/70 (BP Location: Left Arm, Patient Position: Sitting, Cuff Size: Normal)   Pulse 69   Temp 97.9 F (36.6 C) (Oral)   Resp 18   Ht 5\' 7"  (1.702 m)   Wt 166 lb 3.2 oz (75.4 kg)   SpO2 98%   BMI 26.03 kg/m  Wt Readings from Last 3 Encounters:  07/30/20 166 lb 3.2 oz (75.4 kg)  01/26/20 167 lb 12.8 oz (76.1 kg)  09/09/19 163 lb 9.6 oz (74.2 kg)     Lab Results  Component Value Date   WBC 4.6 07/01/2019   HGB 14.5 07/01/2019   HCT 41.7 07/01/2019   PLT 201.0 07/01/2019   GLUCOSE 94 01/26/2020   CHOL 178 01/26/2020   TRIG 129 01/26/2020   HDL 47 (L) 01/26/2020   LDLDIRECT 158.7 03/31/2008   LDLCALC 107 (H) 01/26/2020   ALT 15 01/26/2020   AST 19 01/26/2020   NA 139 01/26/2020   K 4.6 01/26/2020   CL 102 01/26/2020   CREATININE 1.19 (H) 01/26/2020   BUN 18 01/26/2020   CO2 26 01/26/2020   TSH 0.81 07/01/2019    MM 3D SCREEN BREAST BILATERAL  Result Date: 10/06/2019 CLINICAL DATA:  Screening. EXAM: DIGITAL SCREENING BILATERAL MAMMOGRAM WITH TOMO AND CAD COMPARISON:  Previous exam(s). ACR Breast Density Category c: The breast tissue is heterogeneously dense, which may obscure small masses. FINDINGS: There are no findings suspicious for malignancy. Images were processed with CAD. IMPRESSION: No mammographic evidence of malignancy. A result letter of this screening mammogram will be mailed directly to the patient. RECOMMENDATION: Screening mammogram in one year. (Code:SM-B-01Y) BI-RADS CATEGORY  1: Negative. Electronically Signed   By: Lovey Newcomer M.D.   On:  10/06/2019 08:26     ekg--- Non sp st changes --- no change from 02/2017 Assessment & Plan:  Plan    No orders of the defined types were placed in this encounter.   Problem List Items Addressed This Visit      Unprioritized   Acute pain of left shoulder    Not bothering her today If worsens refer to sport med      Chest pain    Probably Ms pain ----from shoulder  ekg --  no change --see above       Relevant Orders   EKG 12-Lead (Completed)   Hyperlipidemia    Tolerating statin, encouraged heart healthy diet, avoid trans fats, minimize simple carbs and saturated fats. Increase exercise as tolerated      Relevant Orders   Lipid panel   Comprehensive metabolic panel   Malignant neoplasm of skin of face    Per dermatology No new lesions      Preventative health care - Primary    ghm utd Check labs  See avs      Relevant Orders   CBC with Differential/Platelet   Lipid panel   TSH   Comprehensive metabolic panel    Other Visit Diagnoses    Non-recurrent acute serous otitis media of both ears       Colon cancer screening       Relevant Orders   Ambulatory referral to Gastroenterology     Dexa: Last completed on 07/07/2012, results were normal, repeat every 2 years.   Colonoscopy: Last completed on 06/07/2010,  polyp, mild diverticulosis, and internal and external hemorrhoids noted, repeat every 10 years.  Mammo: Last completed on 10/04/2019, results were normal, repeat every 1 year.  Pap Smear: Last completed on 06/24/2018, results were normal, repeat every 3 years.  Follow-up: Return in about 6 months (around 01/29/2021), or if symptoms worsen or fail to improve, for hyperlipidemia.   I,Gordon Zheng,acting as a Education administrator for [employer], DO.,have documented all relevant documentation on the behalf of Ann Held, DO,as directed by  Ann Held, DO while in the presence of [place], DO,  have reviewed all documentation for this visit. The documentation on 07/30/20 for the exam, diagnosis, procedures, and orders are all accurate and complete.

## 2020-07-30 NOTE — Assessment & Plan Note (Signed)
Probably Ms pain ----from shoulder  ekg -- no change --see above

## 2020-07-30 NOTE — Assessment & Plan Note (Signed)
ghm utd Check labs  See avs  

## 2020-07-30 NOTE — Patient Instructions (Signed)
Preventive Care 50-62 Years Old, Female Preventive care refers to lifestyle choices and visits with your health care provider that can promote health and wellness. This includes:  A yearly physical exam. This is also called an annual wellness visit.  Regular dental and eye exams.  Immunizations.  Screening for certain conditions.  Healthy lifestyle choices, such as: ? Eating a healthy diet. ? Getting regular exercise. ? Not using drugs or products that contain nicotine and tobacco. ? Limiting alcohol use. What can I expect for my preventive care visit? Physical exam Your health care provider will check your:  Height and weight. These may be used to calculate your BMI (body mass index). BMI is a measurement that tells if you are at a healthy weight.  Heart rate and blood pressure.  Body temperature.  Skin for abnormal spots. Counseling Your health care provider may ask you questions about your:  Past medical problems.  Family's medical history.  Alcohol, tobacco, and drug use.  Emotional well-being.  Home life and relationship well-being.  Sexual activity.  Diet, exercise, and sleep habits.  Work and work Statistician.  Access to firearms.  Method of birth control.  Menstrual cycle.  Pregnancy history. What immunizations do I need? Vaccines are usually given at various ages, according to a schedule. Your health care provider will recommend vaccines for you based on your age, medical history, and lifestyle or other factors, such as travel or where you work.   What tests do I need? Blood tests  Lipid and cholesterol levels. These may be checked every 5 years, or more often if you are over 56 years old.  Hepatitis C test.  Hepatitis B test. Screening  Lung cancer screening. You may have this screening every year starting at age 59 if you have a 30-pack-year history of smoking and currently smoke or have quit within the past 15 years.  Colorectal cancer  screening. ? All adults should have this screening starting at age 24 and continuing until age 53. ? Your health care provider may recommend screening at age 64 if you are at increased risk. ? You will have tests every 1-10 years, depending on your results and the type of screening test.  Diabetes screening. ? This is done by checking your blood sugar (glucose) after you have not eaten for a while (fasting). ? You may have this done every 1-3 years.  Mammogram. ? This may be done every 1-2 years. ? Talk with your health care provider about when you should start having regular mammograms. This may depend on whether you have a family history of breast cancer.  BRCA-related cancer screening. This may be done if you have a family history of breast, ovarian, tubal, or peritoneal cancers.  Pelvic exam and Pap test. ? This may be done every 3 years starting at age 4. ? Starting at age 43, this may be done every 5 years if you have a Pap test in combination with an HPV test. Other tests  STD (sexually transmitted disease) testing, if you are at risk.  Bone density scan. This is done to screen for osteoporosis. You may have this scan if you are at high risk for osteoporosis. Talk with your health care provider about your test results, treatment options, and if necessary, the need for more tests. Follow these instructions at home: Eating and drinking  Eat a diet that includes fresh fruits and vegetables, whole grains, lean protein, and low-fat dairy products.  Take vitamin and mineral supplements  as recommended by your health care provider.  Do not drink alcohol if: ? Your health care provider tells you not to drink. ? You are pregnant, may be pregnant, or are planning to become pregnant.  If you drink alcohol: ? Limit how much you have to 0-1 drink a day. ? Be aware of how much alcohol is in your drink. In the [place] one drink equals one 12 oz bottle of beer (355 mL), one 5 oz glass of  wine (148 mL), or one 1 oz glass of hard liquor (44 mL).   Lifestyle  Take daily care of your teeth and gums. Brush your teeth every morning and night with fluoride toothpaste. Floss one time each day.  Stay active. Exercise for at least 30 minutes 5 or more days each week.  Do not use any products that contain nicotine or tobacco, such as cigarettes, e-cigarettes, and chewing tobacco. If you need help quitting, ask your health care provider.  Do not use drugs.  If you are sexually active, practice safe sex. Use a condom or other form of protection to prevent STIs (sexually transmitted infections).  If you do not wish to become pregnant, use a form of birth control. If you plan to become pregnant, see your health care provider for a prepregnancy visit.  If told by your health care provider, take low-dose aspirin daily starting at age 54.  Find healthy ways to cope with stress, such as: ? Meditation, yoga, or listening to music. ? Journaling. ? Talking to a trusted person. ? Spending time with friends and family. Safety  Always wear your seat belt while driving or riding in a vehicle.  Do not drive: ? If you have been drinking alcohol. Do not ride with someone who has been drinking. ? When you are tired or distracted. ? While texting.  Wear a helmet and other protective equipment during sports activities.  If you have firearms in your house, make sure you follow all gun safety procedures. What's next?  Visit your health care provider once a year for an annual wellness visit.  Ask your health care provider how often you should have your eyes and teeth checked.  Stay up to date on all vaccines. This information is not intended to replace advice given to you by your health care provider. Make sure you discuss any questions you have with your health care provider. Document Revised: 01/10/2020 Document Reviewed: 12/17/2017 Elsevier Patient Education  2021 [employer].

## 2020-07-30 NOTE — Assessment & Plan Note (Signed)
Per dermatology No new lesions

## 2020-07-30 NOTE — Assessment & Plan Note (Signed)
Not bothering her today If worsens refer to sport med

## 2020-07-30 NOTE — Assessment & Plan Note (Signed)
Tolerating statin, encouraged heart healthy diet, avoid trans fats, minimize simple carbs and saturated fats. Increase exercise as tolerated 

## 2020-08-27 ENCOUNTER — Encounter: Payer: Self-pay | Admitting: Gastroenterology

## 2020-08-27 ENCOUNTER — Other Ambulatory Visit: Payer: Self-pay | Admitting: Family Medicine

## 2020-08-27 ENCOUNTER — Encounter: Payer: Self-pay | Admitting: Family Medicine

## 2020-08-27 DIAGNOSIS — Z1231 Encounter for screening mammogram for malignant neoplasm of breast: Secondary | ICD-10-CM

## 2020-08-30 ENCOUNTER — Telehealth: Payer: Self-pay

## 2020-08-30 NOTE — Telephone Encounter (Signed)
Received call from Kay from [facility]- they faxed over a clearance form for sedation dentistry on 08/16/2020- wanted to confirm we received it as they have not gotten it back. Informed that per chart I don't see that we have it. Confirmed fax number with her (she had been faxing to wrong number)- she is resending now. Requesting they get this back today.

## 2020-08-30 NOTE — Telephone Encounter (Signed)
Recieved

## 2020-09-03 NOTE — Telephone Encounter (Signed)
This has been faxed back to [facility].

## 2020-09-06 NOTE — Telephone Encounter (Signed)
[facility] stated they have not received form. Please re-fax to [phone]

## 2020-11-05 ENCOUNTER — Other Ambulatory Visit: Payer: Self-pay

## 2020-11-05 ENCOUNTER — Ambulatory Visit
Admission: RE | Admit: 2020-11-05 | Discharge: 2020-11-05 | Disposition: A | Discharge status: Home / Self Care | Payer: BC Managed Care – PPO | Source: Ambulatory Visit | Attending: Family Medicine | Admitting: Family Medicine

## 2020-11-05 DIAGNOSIS — Z1231 Encounter for screening mammogram for malignant neoplasm of breast: Secondary | ICD-10-CM

## 2020-11-13 ENCOUNTER — Other Ambulatory Visit: Payer: Self-pay

## 2020-11-13 ENCOUNTER — Ambulatory Visit (AMBULATORY_SURGERY_CENTER): Payer: Self-pay

## 2020-11-13 VITALS — Ht 67.0 in | Wt 169.2 lb

## 2020-11-13 DIAGNOSIS — Z1211 Encounter for screening for malignant neoplasm of colon: Secondary | ICD-10-CM

## 2020-11-13 NOTE — Progress Notes (Signed)
No allergies to soy or egg Pt is not on blood thinners or diet pills Denies issues with sedation/intubation Denies atrial flutter/fib Denies constipation   Pt is aware of Covid safety and care partner requirements.   Pt asked if Moviprep was used anymore.  [place] [facility] nurse offered to change prep, but pt declined.

## 2020-11-27 ENCOUNTER — Ambulatory Visit (AMBULATORY_SURGERY_CENTER): Payer: BC Managed Care – PPO | Admitting: Gastroenterology

## 2020-11-27 ENCOUNTER — Encounter: Payer: Self-pay | Admitting: Gastroenterology

## 2020-11-27 ENCOUNTER — Other Ambulatory Visit: Payer: Self-pay

## 2020-11-27 VITALS — BP 143/77 | HR 79 | Temp 98.7°F | Resp 17 | Ht 67.0 in | Wt 169.0 lb

## 2020-11-27 DIAGNOSIS — Z1211 Encounter for screening for malignant neoplasm of colon: Secondary | ICD-10-CM | POA: Diagnosis not present

## 2020-11-27 MED ORDER — SODIUM CHLORIDE 0.9 % IV SOLN
500.0000 mL | Freq: Once | INTRAVENOUS | Status: DC
Start: 2020-11-27 — End: 2020-11-27

## 2020-11-27 NOTE — Patient Instructions (Addendum)
Handout provided on diverticulosis.   Repeat colonoscopy in 10 years for screening purposes.   YOU HAD AN ENDOSCOPIC PROCEDURE TODAY AT [facility]:   Refer to the procedure report that was given to you for any specific questions about what was found during the examination.  If the procedure report does not answer your questions, please call your gastroenterologist to clarify.  If you requested that your care partner not be given the details of your procedure findings, then the procedure report has been included in a sealed envelope for you to review at your convenience later.  YOU SHOULD EXPECT: Some feelings of bloating in the abdomen. Passage of more gas than usual.  Walking can help get rid of the air that was put into your GI tract during the procedure and reduce the bloating. If you had a lower endoscopy (such as a colonoscopy or flexible sigmoidoscopy) you may notice spotting of blood in your stool or on the toilet paper. If you underwent a bowel prep for your procedure, you may not have a normal bowel movement for a few days.  Please Note:  You might notice some irritation and congestion in your nose or some drainage.  This is from the oxygen used during your procedure.  There is no need for concern and it should clear up in a day or so.  SYMPTOMS TO REPORT IMMEDIATELY:  Following lower endoscopy (colonoscopy or flexible sigmoidoscopy):  Excessive amounts of blood in the stool  Significant tenderness or worsening of abdominal pains  Swelling of the abdomen that is new, acute  Fever of 100F or higher  In addition, notify Dr. Eugenia Pancoast office if you have a persistent cough that develops over the next few days or if you have a fever or any other signs of infection.   For urgent or emergent issues, a gastroenterologist can be reached at any hour by calling [phone]. Do not use MyChart messaging for urgent concerns.    DIET:  We do recommend a small meal at first, but  then you may proceed to your regular diet.  Drink plenty of fluids but you should avoid alcoholic beverages for 24 hours.  ACTIVITY:  You should plan to take it easy for the rest of today and you should NOT DRIVE or use heavy machinery until tomorrow (because of the sedation medicines used during the test).    FOLLOW UP: Our staff will call the number listed on your records 48-72 hours following your procedure to check on you and address any questions or concerns that you may have regarding the information given to you following your procedure. If we do not reach you, we will leave a message.  We will attempt to reach you two times.  During this call, we will ask if you have developed any symptoms of COVID 19. If you develop any symptoms (ie: fever, flu-like symptoms, shortness of breath, cough etc.) before then, please call [phone].  If you test positive for Covid 19 in the 2 weeks post procedure, please call and report this information to [place].    If any biopsies were taken you will be contacted by phone or by letter within the next 1-3 weeks.  Please call us at [phone] if you have not heard about the biopsies in 3 weeks.    SIGNATURES/CONFIDENTIALITY: You and/or your care partner have signed paperwork which will be entered into your electronic medical record.  These signatures attest to the fact that that the  information above on your After Visit Summary has been reviewed and is understood.  Full responsibility of the confidentiality of this discharge information lies with you and/or your care-partner.

## 2020-11-27 NOTE — Op Note (Signed)
[place] Patient Name: Tanya Nelson Procedure Date: 11/27/2020 8:51 AM MRN: [id number] Endoscopist: Milus Banister , MD Age: 60 Referring MD:  Date of Birth: 10-Dec-1960 Gender: Female Account #: [id number] Procedure:                Colonoscopy Indications:              Screening for colorectal malignant neoplasm Medicines:                Monitored Anesthesia Care Procedure:                Pre-Anesthesia Assessment:                           - Prior to the procedure, a History and Physical                            was performed, and patient medications and                            allergies were reviewed. The patient's tolerance of                            previous anesthesia was also reviewed. The risks                            and benefits of the procedure and the sedation                            options and risks were discussed with the patient.                            All questions were answered, and informed consent                            was obtained. Prior Anticoagulants: The patient has                            taken no previous anticoagulant or antiplatelet                            agents. ASA Grade Assessment: II - A patient with                            mild systemic disease. After reviewing the risks                            and benefits, the patient was deemed in                            satisfactory condition to undergo the procedure.                           After obtaining informed consent, the colonoscope  was passed under direct vision. Throughout the                            procedure, the patient's blood pressure, pulse, and                            oxygen saturations were monitored continuously. The                            CF [id number] was introduced through the anus                            and advanced to the the cecum, identified by                            appendiceal orifice  and ileocecal valve. The                            colonoscopy was performed without difficulty. The                            patient tolerated the procedure faily well. She                            vomited during the procedure, this was immediately                            addressed by the team and transient hypoxia                            improved quickly (brief bag mask ventilation and                            increasing FI02). The quality of the bowel                            preparation was good. The ileocecal valve,                            appendiceal orifice, and rectum were photographed. Scope In: 9:06:28 AM Scope Out: 9:21:21 AM Scope Withdrawal Time: 0 hours 10 minutes 51 seconds  Total Procedure Duration: 0 hours 14 minutes 53 seconds  Findings:                 A few small and large-mouthed diverticula were                            found in the sigmoid colon.                           The exam was otherwise without abnormality on                            direct and retroflexion views. Complications:  No immediate complications. Estimated blood loss:                            None. Estimated Blood Loss:     Estimated blood loss: none. Impression:               - Diverticulosis in the sigmoid colon.                           - The examination was otherwise normal on direct                            and retroflexion views.                           - No polyps or cancers. Recommendation:           - Patient has a contact number available for                            emergencies. The signs and symptoms of potential                            delayed complications were discussed with the                            patient. Return to normal activities tomorrow.                            Written discharge instructions were provided to the                            patient.                           - Resume previous diet.                            - Continue present medications.                           - Repeat colonoscopy in 10 years for screening. Milus Banister, MD 11/27/2020 9:28:26 AM This report has been signed electronically.

## 2020-11-27 NOTE — Progress Notes (Signed)
Pt's states no medical or surgical changes since previsit or office visit. 

## 2020-11-27 NOTE — Progress Notes (Signed)
C.W. vital signs. 

## 2020-11-27 NOTE — Progress Notes (Signed)
Pt had vomiting episode during procedure. Instructed pt to notify Dr. Eugenia Pancoast office immediately if she develops a persistent cough, fever, or any other signs of infection after her procedure. Pt and husband verbalized understanding.

## 2020-11-27 NOTE — Progress Notes (Signed)
HPI: This is a woman at routine risk for colon cancer   ROS: complete GI ROS as described in HPI, all other review negative.  Constitutional:  No unintentional weight loss   Past Medical History:  Diagnosis Date   Acute lymphadenitis    Cancer (HCC)    Basal Cell--Face   Heart murmur    Hyperlipidemia     Past Surgical History:  Procedure Laterality Date   ABDOMINAL HYSTERECTOMY     MOHS SURGERY  08/2008   [facility]    Current Outpatient Medications  Medication Sig Dispense Refill   estradiol (CLIMARA - DOSED IN MG/24 HR) 0.05 mg/24hr patch Place 1 patch (0.05 mg total) onto the skin once a week. 12 patch 1   pravastatin (PRAVACHOL) 40 MG tablet Take 1 tablet (40 mg total) by mouth daily. 90 tablet 1   Multiple Vitamin (MULTIVITAMIN) tablet Take 1 tablet by mouth daily.     Current Facility-Administered Medications  Medication Dose Route Frequency Provider Last Rate Last Admin   0.9 %  sodium chloride infusion  500 mL Intravenous Once Milus Banister, MD        Allergies as of 11/27/2020   (No Known Allergies)    Family History  Problem Relation Age of Onset   Hypertension Mother        Brain aneurysm   Aneurysm Mother    Prostate cancer Father    Hypertension Other    Breast cancer Neg Hx    Colon cancer Neg Hx    Colon polyps Neg Hx    Esophageal cancer Neg Hx    Rectal cancer Neg Hx    Stomach cancer Neg Hx     Social History   Socioeconomic History   Marital status: Married    Spouse name: Not on file   Number of children: Not on file   Years of education: Not on file   Highest education level: Not on file  Occupational History   Occupation: [employer]    Employer: [employer]  Tobacco Use   Smoking status: Never   Smokeless tobacco: Never  Vaping Use   Vaping Use: Never used  Substance and Sexual Activity   Alcohol use: Yes    Comment: rare   Drug use: No   Sexual activity: Yes    Partners: Male  Other Topics Concern   Not on file  Social  History Narrative   Exercise-no   Social Determinants of Health   Financial Resource Strain: Not on file  Food Insecurity: Not on file  Transportation Needs: Not on file  Physical Activity: Not on file  Stress: Not on file  Social Connections: Not on file  Intimate Partner Violence: Not on file     Physical Exam: BP (!) 161/88   Pulse 71   Temp 98.7 F (37.1 C)   Ht '5\' 7"'$  (1.702 m)   Wt 169 lb (76.7 kg)   SpO2 100%   BMI 26.47 kg/m  Constitutional: generally well-appearing Psychiatric: alert and oriented x3 Abdomen: soft, nontender, nondistended, no obvious ascites, no peritoneal signs, normal bowel sounds No peripheral edema noted in lower extremities  Assessment and plan: 60 y.o. female with at routine risk for CRC  For colonoscop screening today.  Please see the "Patient Instructions" section for addition details about the plan.  Owens Loffler, MD [facility] 11/27/2020, 8:58 AM

## 2020-11-27 NOTE — Progress Notes (Signed)
PT taken to PACU. Monitors in place. VSS. Report given to RN.   Pt vomited approximately 477m of dark colored fluid with a moderate amount of solid material. Transient drop in SPO2 was quickly improved with suctioning and additional oxygen via ambubag. Signs and symptoms of aspiration have been discussed with patient and I have instructed her to seek medical attention if any issues arise.

## 2020-11-29 ENCOUNTER — Telehealth: Payer: Self-pay

## 2020-11-29 NOTE — Telephone Encounter (Signed)
  Follow up Call-  Call back number 11/27/2020  Post procedure Call Back phone  # [phone]  Permission to leave phone message Yes  Some recent data might be hidden     Patient questions:  Do you have a fever, pain , or abdominal swelling? No. Pain Score  0 *  Have you tolerated food without any problems? Yes.    Have you been able to return to your normal activities? Yes.    Do you have any questions about your discharge instructions: Diet   No. Medications  No. Follow up visit  No.  Do you have questions or concerns about your Care? No.  Actions: * If pain score is 4 or above: No action needed, pain <4.

## 2020-11-30 ENCOUNTER — Other Ambulatory Visit: Payer: Self-pay | Admitting: Family Medicine

## 2020-11-30 DIAGNOSIS — E785 Hyperlipidemia, unspecified: Secondary | ICD-10-CM

## 2020-12-04 IMAGING — MG DIGITAL SCREENING BILAT W/ TOMO W/ CAD
8 series · 8 of 24 positions shown · non-contrast
Comparison: Previous exam(s).

CLINICAL DATA: Screening.

EXAM:
DIGITAL SCREENING BILATERAL MAMMOGRAM WITH TOMO AND CAD

[L CC synth-2D]
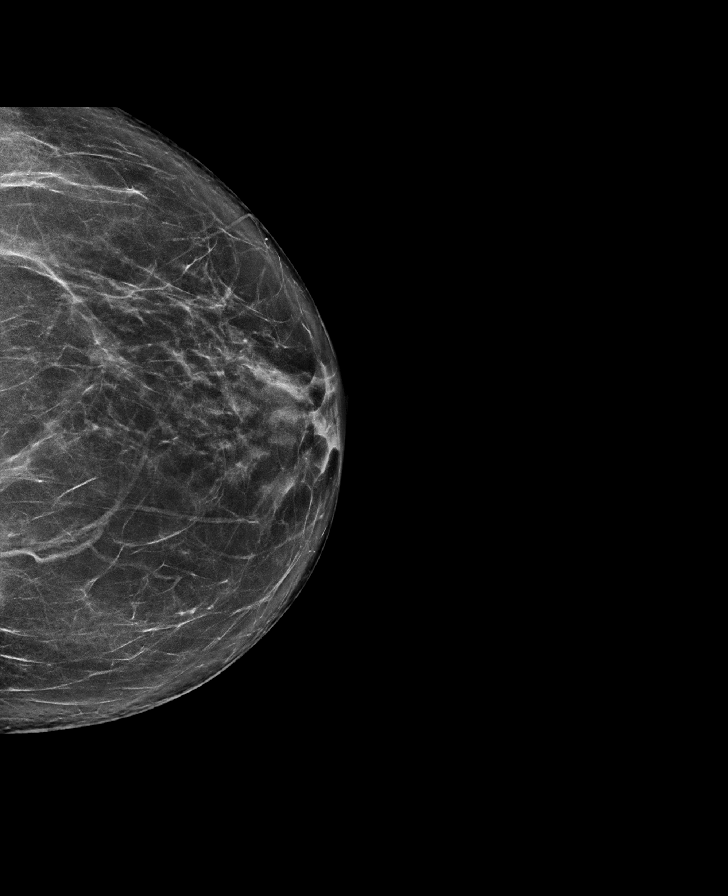

[L MLO synth-2D]
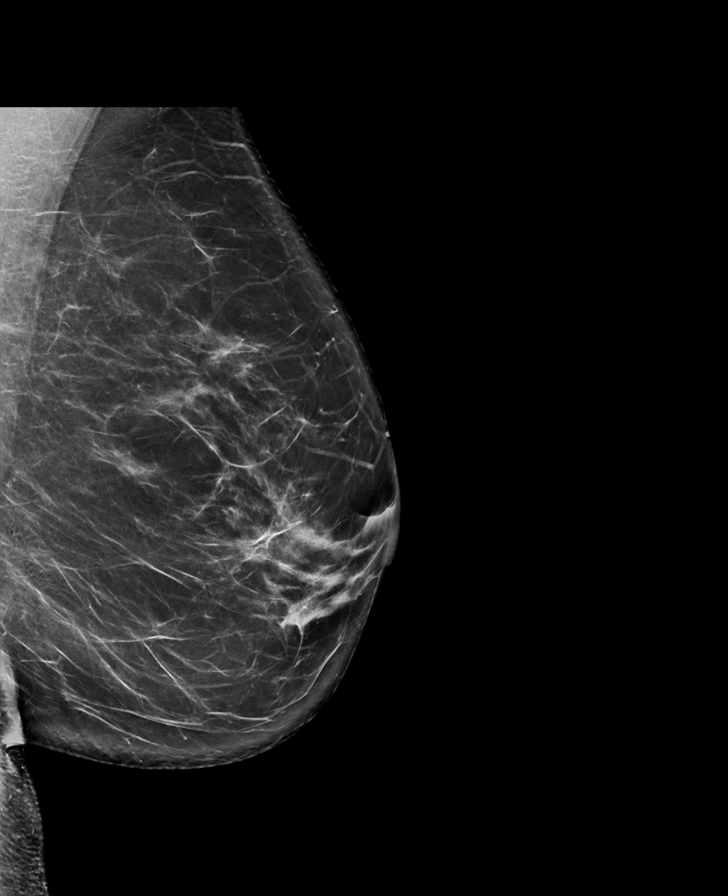

[R CC synth-2D]
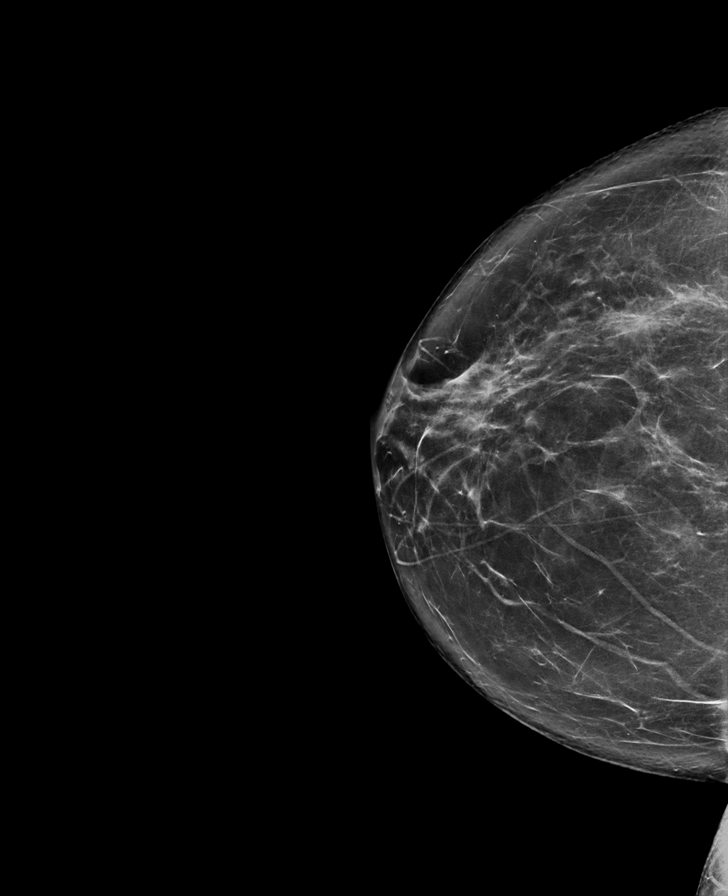

[R MLO synth-2D]
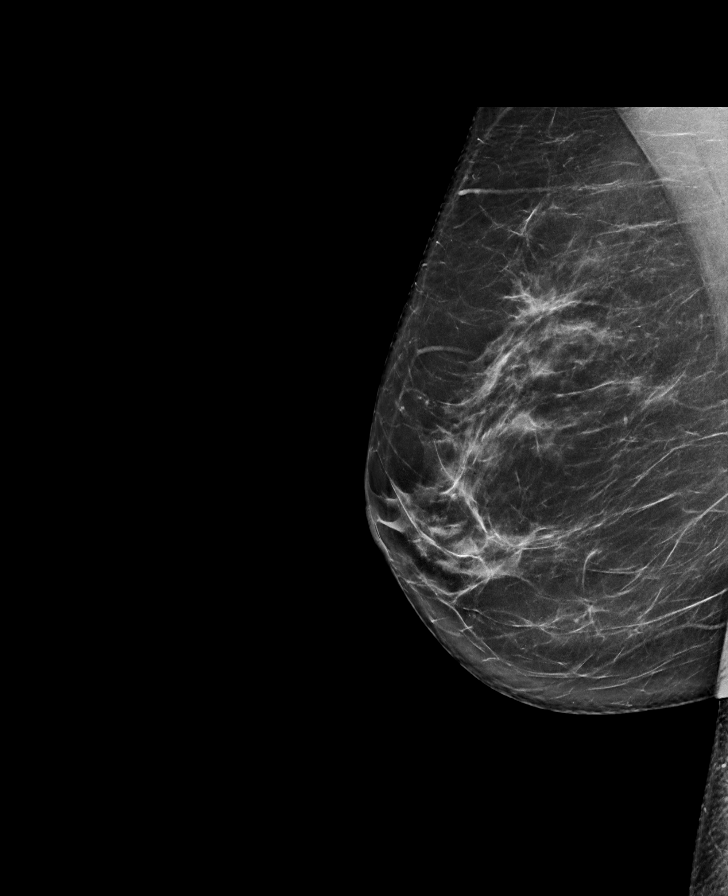

[R MLO tomo · tomo slice 43/84.0]
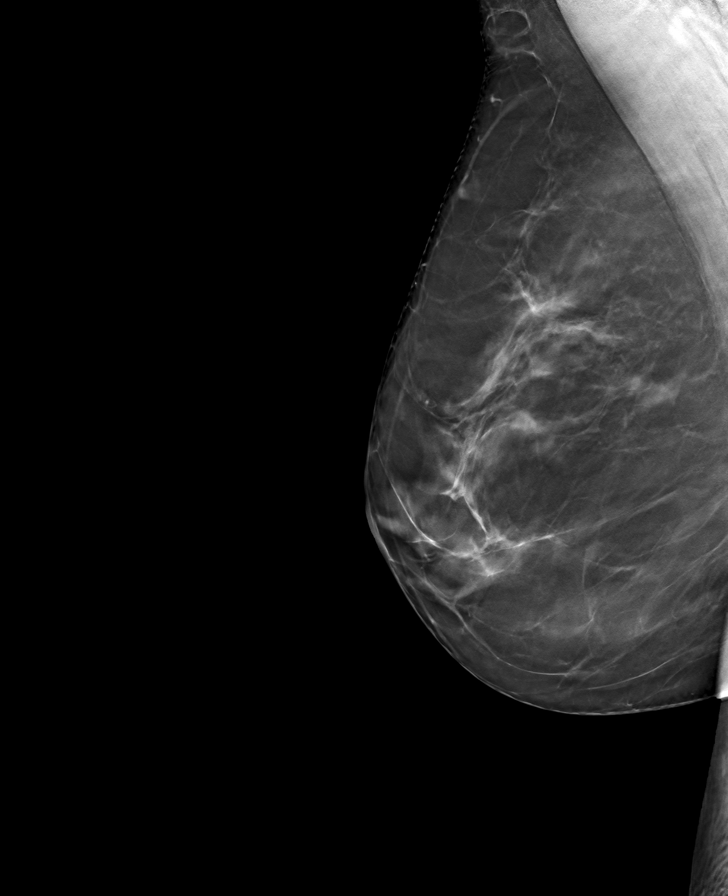

[R CC tomo · tomo slice 40/79.0]
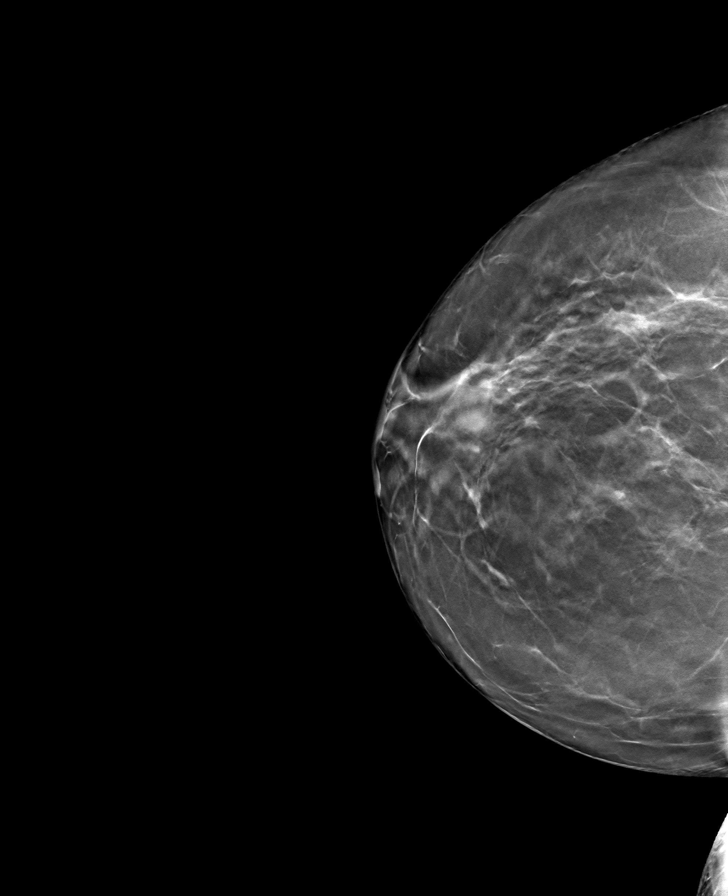

[L CC tomo · tomo slice 41/80.0]
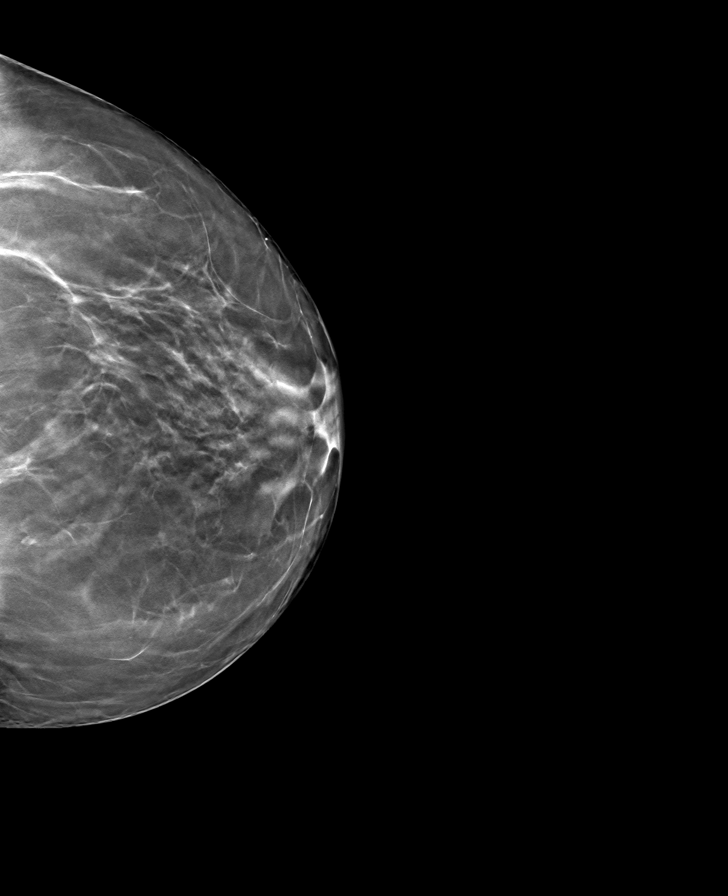

[L MLO tomo · tomo slice 43/84.0]
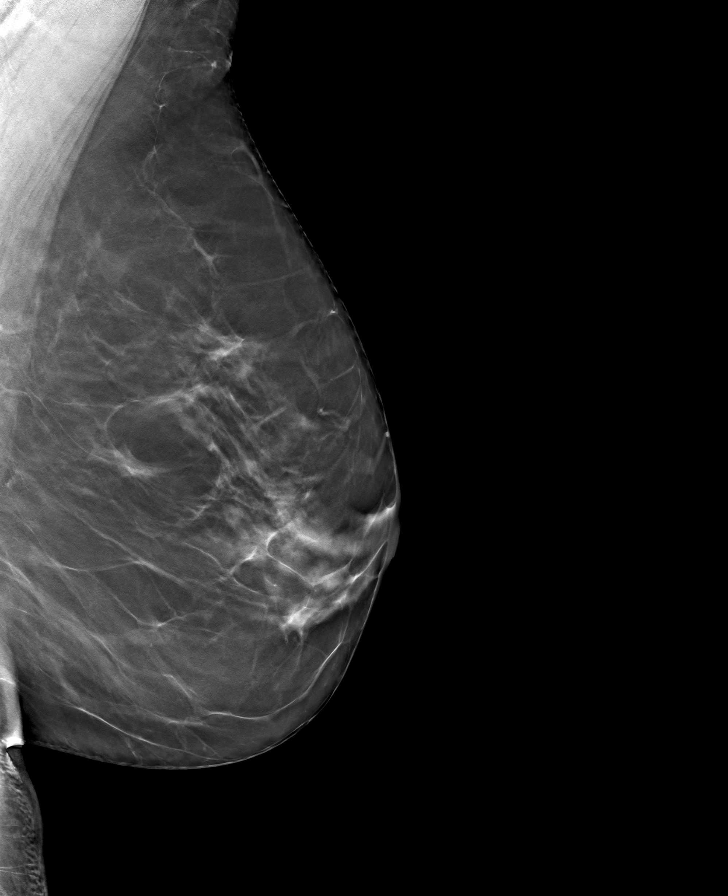

[8 of 24 positions shown; findings below may reference images not displayed]

ACR Breast Density Category c: The breast tissue is heterogeneously
dense, which may obscure small masses.
FINDINGS: There are no findings suspicious for malignancy. Images were
processed with CAD.
IMPRESSION: No mammographic evidence of malignancy. A result letter of this
screening mammogram will be mailed directly to the patient.

RECOMMENDATION:
Screening mammogram in one year. (Code:[id number])

BI-RADS CATEGORY  1: Negative.

## 2021-01-31 ENCOUNTER — Other Ambulatory Visit: Payer: Self-pay

## 2021-01-31 ENCOUNTER — Ambulatory Visit: Payer: BC Managed Care – PPO | Admitting: Family Medicine

## 2021-01-31 ENCOUNTER — Encounter: Payer: Self-pay | Admitting: Family Medicine

## 2021-01-31 VITALS — BP 118/70 | HR 64 | Temp 98.0°F | Resp 18 | Ht 67.0 in | Wt 168.4 lb

## 2021-01-31 DIAGNOSIS — Z23 Encounter for immunization: Secondary | ICD-10-CM

## 2021-01-31 DIAGNOSIS — Z780 Asymptomatic menopausal state: Secondary | ICD-10-CM | POA: Diagnosis not present

## 2021-01-31 DIAGNOSIS — E785 Hyperlipidemia, unspecified: Secondary | ICD-10-CM

## 2021-01-31 DIAGNOSIS — C44300 Unspecified malignant neoplasm of skin of unspecified part of face: Secondary | ICD-10-CM | POA: Diagnosis not present

## 2021-01-31 LAB — LIPID PANEL
Cholesterol: 154 mg/dL (ref 0–200)
HDL: 52.4 mg/dL (ref >39.00)
LDL Cholesterol: 79 mg/dL (ref 0–99)
NonHDL: 101.55
Total CHOL/HDL Ratio: 3
Triglycerides: 112 mg/dL (ref 0.0–149.0)
VLDL: 22.4 mg/dL (ref 0.0–40.0)

## 2021-01-31 LAB — COMPREHENSIVE METABOLIC PANEL
ALT: 16 U/L (ref 0–35)
AST: 19 U/L (ref 0–37)
Albumin: 4.4 g/dL (ref 3.5–5.2)
Alkaline Phosphatase: 50 U/L (ref 39–117)
BUN: 15 mg/dL (ref 6–23)
CO2: 30 mEq/L (ref 19–32)
Calcium: 9.3 mg/dL (ref 8.4–10.5)
Chloride: 103 mEq/L (ref 96–112)
Creatinine, Ser: 1.03 mg/dL (ref 0.40–1.20)
GFR: 58.34 mL/min — ABNORMAL LOW (ref >60.00)
Glucose, Bld: 94 mg/dL (ref 70–99)
Potassium: 4.2 mEq/L (ref 3.5–5.1)
Sodium: 139 mEq/L (ref 135–145)
Total Bilirubin: 0.5 mg/dL (ref 0.2–1.2)
Total Protein: 7.4 g/dL (ref 6.0–8.3)

## 2021-01-31 NOTE — Assessment & Plan Note (Signed)
Encourage heart healthy diet such as MIND or DASH diet, increase exercise, avoid trans fats, simple carbohydrates and processed foods, consider a krill or fish or flaxseed oil cap daily.  °

## 2021-01-31 NOTE — Patient Instructions (Signed)
Lipid Profile Test Why am I having this test? The lipid profile test can be used to help evaluate your risk for developing heart disease. The test is also used to monitor your levels during treatment for high cholesterol to see if you are reaching your goals. What is being tested? A lipid profile measures the following: Total cholesterol. Cholesterol is a waxy, fat-like substance in your blood. If your total cholesterol level is high, this can increase your risk for heart disease. High-density lipoprotein (HDL). This is known as the good cholesterol. Having high levels of HDL decreases your risk for heart disease. Your HDL level may be low if you smoke or do not get enough exercise. Low-density lipoprotein (LDL). This is known as the bad cholesterol. This type causes plaque to build up in your arteries. Having a low level of LDL is best. Having high levels of LDL increases your risk for heart disease. Cholesterol to HDL ratio. This is calculated by dividing your total cholesterol by your HDL cholesterol. The ratio is used by health care providers to determine your risk for heart disease. A low ratio is best. Triglycerides. These are fats that your body can store or burn for energy. Low levels are best. Having high levels of triglycerides increases your risk for heart disease. What kind of sample is taken? A blood sample is required for this test. It is usually collected by inserting a needle into a blood vessel. How do I prepare for this test? Follow instructions from your health care provider about changing or stopping your regular medicines. Do not eat or drink anything except water starting 9-12 hours before your test, or as long as told by your health care provider. Do not drink alcohol starting at least 24 hours before your test. Follow any instructions from your health care provider about dietary restrictions before your test. Tell a health care provider about: All medicines you are taking,  including vitamins, herbs, eye drops, creams, and over-the-counter medicines. Any medical conditions you have. Whether you are pregnant or may be pregnant. How are the results reported? Your test results will be reported as values that indicate your cholesterol and triglyceride levels. Your health care provider will compare your results to normal ranges that were established after testing a large group of people (reference ranges). Reference ranges may vary among labs and hospitals. For this test, common reference ranges are: Total cholesterol Adult or elderly: less than 200 mg/dL. Child: 120-200 mg/dL. HDL Female: greater than 45 mg/dL. Female: greater than 55 mg/dL. HDL reference values indicating your risk for heart disease: Low risk for heart disease: Female: 60 mg/dL. Female: 70 mg/dL. Moderate risk for heart disease: Female: 45 mg/dL. Female: 55 mg/dL. High risk for heart disease: Female: 25 mg/dL. Female: 35 mg/dL. LDL Adults: Your health care provider will determine a target level for LDL based on your risk for heart disease. If you are at low risk, your LDL should be 130 mg/dL or less. If you are at moderate risk, your LDL should be 100 mg/dL or less. If you are at high risk, your LDL should be 70 mg/dL or less. Children: less than 110 mg/dL. Cholesterol to HDL ratio Reference values indicating your risk for heart disease: Risk that is half the average risk: Female: 3.4. Female: 3.3. Average risk: Female: 5.0. Female: 4.4. Risk that is two times average (moderate risk): Female: 10.0. Female: 7.0. Risk that is three times average (high risk): Female: 24.0. Female: 11.0. Triglycerides Adult or elderly:  Female: 40-160 mg/dL. Female: 35-135 mg/dL. Children 46-92 years old: Female: 40-163 mg/dL. Female: 40-128 mg/dL. Children 70-25 years old: Female: 36-138 mg/dL. Female: 41-138 mg/dL. Children 50-62 years old: Female: 31-108 mg/dL. Female: 35-114 mg/dL. Children 0-5 years  old: Female: 30-86 mg/dL. Female: 32-99 mg/dL. Triglycerides should be less than 400 mg/dL even when you are not fasting. What do the results mean? Results that are within the reference ranges are considered normal. Total cholesterol, LDL, and triglyceride levels that are higher than the reference ranges can mean that you have an increased risk for heart disease. An HDL level that is lower than the reference range can also indicate an increased risk. Talk with your health care provider about what your results mean. Questions to ask your health care provider Ask your health care provider, or the department that is doing the test: When will my results be ready? How will I get my results? What are my treatment options? What other tests do I need? What are my next steps? Summary The lipid profile test can be used to help predict the likelihood that you will develop heart disease. It can also help monitor your cholesterol levels during treatment. A lipid profile measures your total cholesterol, high-density lipoprotein (HDL), low-density lipoprotein (LDL), cholesterol to HDL ratio, and triglycerides. Total cholesterol, LDL, and triglyceride levels that are higher than the reference ranges can indicate an increased risk for heart disease. An HDL level that is lower than the reference range can indicate an increased risk for heart disease. Talk with your health care provider about what your results mean. This information is not intended to replace advice given to you by your health care provider. Make sure you discuss any questions you have with your health care provider. Document Revised: 07/18/2020 Document Reviewed: 07/18/2020 Elsevier Patient Education  [place].

## 2021-01-31 NOTE — Assessment & Plan Note (Signed)
On HRT

## 2021-01-31 NOTE — Progress Notes (Signed)
Established Patient Office Visit  Subjective:  Patient ID: Tanya Nelson, female    DOB: 10-28-1960  Age: 60 y.o. MRN: [id number]  CC:  Chief Complaint  Patient presents with   Hyperlipidemia   Follow-up    HPI [name] presents for f/u cholesterol and flu shot.   No complaints today.  No refills needed  Past Medical History:  Diagnosis Date   Acute lymphadenitis    Cancer ([place])    Basal Cell--Face   Heart murmur    Hyperlipidemia     Past Surgical History:  Procedure Laterality Date   ABDOMINAL HYSTERECTOMY     MOHS SURGERY  08/2008   [facility]    Family History  Problem Relation Age of Onset   Hypertension Mother        Brain aneurysm   Aneurysm Mother    Prostate cancer Father    Hypertension Other    Breast cancer Neg Hx    Colon cancer Neg Hx    Colon polyps Neg Hx    Esophageal cancer Neg Hx    Rectal cancer Neg Hx    Stomach cancer Neg Hx     Social History   Socioeconomic History   Marital status: Married    Spouse name: Not on file   Number of children: Not on file   Years of education: Not on file   Highest education level: Not on file  Occupational History   Occupation: [employer]    Employer: [employer]  Tobacco Use   Smoking status: Never   Smokeless tobacco: Never  Vaping Use   Vaping Use: Never used  Substance and Sexual Activity   Alcohol use: Yes    Comment: rare   Drug use: No   Sexual activity: Yes    Partners: Male  Other Topics Concern   Not on file  Social History Narrative   Exercise-no   Social Determinants of Health   Financial Resource Strain: Not on file  Food Insecurity: Not on file  Transportation Needs: Not on file  Physical Activity: Not on file  Stress: Not on file  Social Connections: Not on file  Intimate Partner Violence: Not on file    Outpatient Medications Prior to Visit  Medication Sig Dispense Refill   estradiol (CLIMARA - DOSED IN MG/24 HR) 0.05 mg/24hr patch APPLY 1 PATCH(0.05 MG)  TOPICALLY TO THE SKIN 1 TIME A WEEK 12 patch 1   Multiple Vitamin (MULTIVITAMIN) tablet Take 1 tablet by mouth daily.     pravastatin (PRAVACHOL) 40 MG tablet TAKE 1 TABLET(40 MG) BY MOUTH DAILY 90 tablet 1   No facility-administered medications prior to visit.    No Known Allergies  ROS Review of Systems  Constitutional:  Negative for appetite change, diaphoresis, fatigue and unexpected weight change.  Eyes:  Negative for pain, redness and visual disturbance.  Respiratory:  Negative for cough, chest tightness, shortness of breath and wheezing.   Cardiovascular:  Negative for chest pain, palpitations and leg swelling.  Endocrine: Negative for cold intolerance, heat intolerance, polydipsia, polyphagia and polyuria.  Genitourinary:  Negative for difficulty urinating, dysuria and frequency.  Neurological:  Negative for dizziness, light-headedness, numbness and headaches.     Objective:    Physical Exam Vitals and nursing note reviewed.  Constitutional:      Appearance: She is well-developed.  HENT:     Head: Normocephalic and atraumatic.  Eyes:     Conjunctiva/sclera: Conjunctivae normal.  Neck:     Thyroid:  No thyromegaly.     Vascular: No carotid bruit or JVD.  Cardiovascular:     Rate and Rhythm: Normal rate and regular rhythm.     Heart sounds: Normal heart sounds. No murmur heard. Pulmonary:     Effort: Pulmonary effort is normal. No respiratory distress.     Breath sounds: Normal breath sounds. No wheezing or rales.  Chest:     Chest wall: No tenderness.  Musculoskeletal:     Cervical back: Normal range of motion and neck supple.  Neurological:     Mental Status: She is alert and oriented to person, place, and time.  Psychiatric:        Mood and Affect: Mood normal.        Behavior: Behavior normal.        Thought Content: Thought content normal.        Judgment: Judgment normal.    BP 118/70 (BP Location: Left Arm, Patient Position: Sitting, Cuff Size: Normal)    Pulse 64   Temp 98 F (36.7 C) (Oral)   Resp 18   Ht 5\' 7"  (1.702 m)   Wt 168 lb 6.4 oz (76.4 kg)   SpO2 98%   BMI 26.38 kg/m  Wt Readings from Last 3 Encounters:  01/31/21 168 lb 6.4 oz (76.4 kg)  11/27/20 169 lb (76.7 kg)  11/13/20 169 lb 3.2 oz (76.7 kg)     Health Maintenance Due  Topic Date Due   COVID-19 Vaccine (5 - Booster for Pfizer series) 12/28/2020    There are no preventive care reminders to display for this patient.  Lab Results  Component Value Date   TSH 0.81 07/30/2020   Lab Results  Component Value Date   WBC 4.3 07/30/2020   HGB 14.1 07/30/2020   HCT 41.2 07/30/2020   MCV 91.2 07/30/2020   PLT 180.0 07/30/2020   Lab Results  Component Value Date   NA 139 07/30/2020   K 4.2 07/30/2020   CO2 29 07/30/2020   GLUCOSE 79 07/30/2020   BUN 18 07/30/2020   CREATININE 0.99 07/30/2020   BILITOT 0.6 07/30/2020   ALKPHOS 50 07/30/2020   AST 17 07/30/2020   ALT 14 07/30/2020   PROT 7.2 07/30/2020   ALBUMIN 4.3 07/30/2020   CALCIUM 9.8 07/30/2020   GFR 61.40 07/30/2020   Lab Results  Component Value Date   CHOL 130 07/30/2020   Lab Results  Component Value Date   HDL 46.70 07/30/2020   Lab Results  Component Value Date   LDLCALC 64 07/30/2020   Lab Results  Component Value Date   TRIG 96.0 07/30/2020   Lab Results  Component Value Date   CHOLHDL 3 07/30/2020   No results found for: HGBA1C    Assessment & Plan:   Problem List Items Addressed This Visit       Unprioritized   Asymptomatic postmenopausal status    On HRT      Hyperlipidemia - Primary    Encourage heart healthy diet such as MIND or DASH diet, increase exercise, avoid trans fats, simple carbohydrates and processed foods, consider a krill or fish or flaxseed oil cap daily.       Relevant Orders   Comprehensive metabolic panel   Lipid panel   Malignant neoplasm of skin of face    Pt sees derm       Other Visit Diagnoses     Need for influenza  vaccination       Relevant Orders  Flu Vaccine QUAD 33mo+IM (Fluarix, Fluzone & Alfiuria Quad PF) (Completed)       No orders of the defined types were placed in this encounter.   Follow-up: Return in about 6 months (around 08/01/2021), or if symptoms worsen or fail to improve, for annual exam, fasting.    Ann Held, DO

## 2021-01-31 NOTE — Assessment & Plan Note (Signed)
Pt sees derm

## 2021-05-17 ENCOUNTER — Other Ambulatory Visit: Payer: Self-pay | Admitting: Family Medicine

## 2021-06-03 ENCOUNTER — Other Ambulatory Visit: Payer: Self-pay | Admitting: Family Medicine

## 2021-06-03 DIAGNOSIS — E785 Hyperlipidemia, unspecified: Secondary | ICD-10-CM

## 2021-08-06 ENCOUNTER — Encounter: Payer: BC Managed Care – PPO | Admitting: Family Medicine

## 2021-08-15 ENCOUNTER — Ambulatory Visit (INDEPENDENT_AMBULATORY_CARE_PROVIDER_SITE_OTHER): Payer: BC Managed Care – PPO | Admitting: Family Medicine

## 2021-08-15 ENCOUNTER — Encounter: Payer: Self-pay | Admitting: Family Medicine

## 2021-08-15 VITALS — BP 118/86 | HR 68 | Temp 97.8°F | Resp 18 | Ht 67.0 in | Wt 167.0 lb

## 2021-08-15 DIAGNOSIS — Z0000 Encounter for general adult medical examination without abnormal findings: Secondary | ICD-10-CM

## 2021-08-15 DIAGNOSIS — R002 Palpitations: Secondary | ICD-10-CM | POA: Diagnosis not present

## 2021-08-15 DIAGNOSIS — E2839 Other primary ovarian failure: Secondary | ICD-10-CM

## 2021-08-15 DIAGNOSIS — E785 Hyperlipidemia, unspecified: Secondary | ICD-10-CM

## 2021-08-15 LAB — CBC WITH DIFFERENTIAL/PLATELET
Basophils Absolute: 0 10*3/uL (ref 0.0–0.1)
Basophils Relative: 0.6 % (ref 0.0–3.0)
Eosinophils Absolute: 0.1 10*3/uL (ref 0.0–0.7)
Eosinophils Relative: 1.7 % (ref 0.0–5.0)
HCT: 43.4 % (ref 36.0–46.0)
Hemoglobin: 14.5 g/dL (ref 12.0–15.0)
Lymphocytes Relative: 35 % (ref 12.0–46.0)
Lymphs Abs: 1.6 10*3/uL (ref 0.7–4.0)
MCHC: 33.4 g/dL (ref 30.0–36.0)
MCV: 92.7 fl (ref 78.0–100.0)
Monocytes Absolute: 0.5 10*3/uL (ref 0.1–1.0)
Monocytes Relative: 10.7 % (ref 3.0–12.0)
Neutro Abs: 2.3 10*3/uL (ref 1.4–7.7)
Neutrophils Relative %: 52 % (ref 43.0–77.0)
Platelets: 206 10*3/uL (ref 150.0–400.0)
RBC: 4.68 Mil/uL (ref 3.87–5.11)
RDW: 12.6 % (ref 11.5–15.5)
WBC: 4.5 10*3/uL (ref 4.0–10.5)

## 2021-08-15 LAB — LIPID PANEL
Cholesterol: 159 mg/dL (ref 0–200)
HDL: 50.9 mg/dL (ref >39.00)
LDL Cholesterol: 87 mg/dL (ref 0–99)
NonHDL: 108.31
Total CHOL/HDL Ratio: 3
Triglycerides: 109 mg/dL (ref 0.0–149.0)
VLDL: 21.8 mg/dL (ref 0.0–40.0)

## 2021-08-15 LAB — COMPREHENSIVE METABOLIC PANEL
ALT: 20 U/L (ref 0–35)
AST: 21 U/L (ref 0–37)
Albumin: 4.5 g/dL (ref 3.5–5.2)
Alkaline Phosphatase: 53 U/L (ref 39–117)
BUN: 15 mg/dL (ref 6–23)
CO2: 31 mEq/L (ref 19–32)
Calcium: 9.5 mg/dL (ref 8.4–10.5)
Chloride: 102 mEq/L (ref 96–112)
Creatinine, Ser: 1.03 mg/dL (ref 0.40–1.20)
GFR: 58.12 mL/min — ABNORMAL LOW (ref >60.00)
Glucose, Bld: 92 mg/dL (ref 70–99)
Potassium: 4.3 mEq/L (ref 3.5–5.1)
Sodium: 138 mEq/L (ref 135–145)
Total Bilirubin: 0.6 mg/dL (ref 0.2–1.2)
Total Protein: 7.3 g/dL (ref 6.0–8.3)

## 2021-08-15 LAB — TSH: TSH: 0.96 u[IU]/mL (ref 0.35–5.50)

## 2021-08-15 NOTE — Progress Notes (Signed)
Subjective:  ?  ? Tanya Nelson is a 61 y.o. female and is here for a comprehensive physical exam. The patient reports that she con't to have palpitations several x a week.  No sob.  Pt is concerned about her heart.  She admits to being under a lot of stress at work  no other complaints . ? ?Social History  ? ?Socioeconomic History  ? Marital status: Married  ?  Spouse name: Not on file  ? Number of children: Not on file  ? Years of education: Not on file  ? Highest education level: Not on file  ?Occupational History  ? Occupation: [employer]  ?  Employer: [employer]  ?Tobacco Use  ? Smoking status: Never  ? Smokeless tobacco: Never  ?Vaping Use  ? Vaping Use: Never used  ?Substance and Sexual Activity  ? Alcohol use: Yes  ?  Comment: rare  ? Drug use: No  ? Sexual activity: Yes  ?  Partners: Male  ?Other Topics Concern  ? Not on file  ?Social History Narrative  ? Exercise-no  ? ?Social Determinants of Health  ? ?Financial Resource Strain: Not on file  ?Food Insecurity: Not on file  ?Transportation Needs: Not on file  ?Physical Activity: Not on file  ?Stress: Not on file  ?Social Connections: Not on file  ?Intimate Partner Violence: Not on file  ? ?Health Maintenance  ?Topic Date Due  ? COVID-19 Vaccine (5 - Booster for Pfizer series) 10/22/2020  ? MAMMOGRAM  11/05/2021  ? INFLUENZA VACCINE  11/19/2021  ? TETANUS/TDAP  06/30/2029  ? COLONOSCOPY (Pts 45-40yr Insurance coverage will need to be confirmed)  11/28/2030  ? Hepatitis C Screening  Completed  ? HIV Screening  Completed  ? Zoster Vaccines- Shingrix  Completed  ? HPV VACCINES  Aged Out  ? ? ?The following portions of the patient's history were reviewed and updated as appropriate: She  has a past medical history of Acute lymphadenitis, Cancer ([place], Heart murmur, and Hyperlipidemia. ?She does not have any pertinent problems on file. ?She  has a past surgical history that includes Abdominal hysterectomy and Mohs surgery (08/2008). ?Her family history includes  Aneurysm in her mother; Hypertension in her mother and another family member; Prostate cancer in her father. ?She  reports that she has never smoked. She has never used smokeless tobacco. She reports current alcohol use. She reports that she does not use drugs. ?She has a current medication list which includes the following prescription(s): estradiol, multivitamin, and pravastatin. ?Current Outpatient Medications on File Prior to Visit  ?Medication Sig Dispense Refill  ? estradiol (CLIMARA - DOSED IN MG/24 HR) 0.05 mg/24hr patch APPLY 1 PATCH(0.05 MG) TOPICALLY TO THE SKIN 1 TIME A WEEK 12 patch 1  ? Multiple Vitamin (MULTIVITAMIN) tablet Take 1 tablet by mouth daily.    ? pravastatin (PRAVACHOL) 40 MG tablet TAKE 1 TABLET(40 MG) BY MOUTH DAILY 90 tablet 1  ? ?No current facility-administered medications on file prior to visit.  ? ?She has No Known Allergies.. ? ?Review of Systems ?Review of Systems  ?Constitutional: Negative for activity change, appetite change and fatigue.  ?HENT: Negative for hearing loss, congestion, tinnitus and ear discharge.  dentist q620mEyes: Negative for visual disturbance (see optho q1y -- vision corrected to 20/20 with glasses).  ?Respiratory: Negative for cough, chest tightness and shortness of breath.   ?Cardiovascular: Negative for chest pain, palpitations and leg swelling.  ?Gastrointestinal: Negative for abdominal pain, diarrhea, constipation and abdominal distention.  ?  Genitourinary: Negative for urgency, frequency, decreased urine volume and difficulty urinating.  ?Musculoskeletal: Negative for back pain, arthralgias and gait problem.  ?Skin: Negative for color change, pallor and rash.  ?Neurological: Negative for dizziness, light-headedness, numbness and headaches.  ?Hematological: Negative for adenopathy. Does not bruise/bleed easily.  ?Psychiatric/Behavioral: Negative for suicidal ideas, confusion, sleep disturbance, self-injury, dysphoric mood, decreased concentration and  agitation.  ? ? ? ? ?Objective:  ? ? BP 118/86 (BP Location: Left Arm, Patient Position: Sitting, Cuff Size: Normal)   Pulse 68   Temp 97.8 ?F (36.6 ?C) (Oral)   Resp 18   Ht '5\' 7"'$  (1.702 m)   Wt 167 lb (75.8 kg)   SpO2 98%   BMI 26.16 kg/m?  ?General appearance: alert, cooperative, appears stated age, and no distress ?Head: Normocephalic, without obvious abnormality, atraumatic ?Eyes: conjunctivae/corneas clear. PERRL, EOM's intact. Fundi benign. ?Ears: normal TM's and external ear canals both ears ?Nose: Nares normal. Septum midline. Mucosa normal. No drainage or sinus tenderness. ?Throat: lips, mucosa, and tongue normal; teeth and gums normal ?Neck: no adenopathy, no carotid bruit, no JVD, supple, symmetrical, trachea midline, and thyroid not enlarged, symmetric, no tenderness/mass/nodules ?Back: negative ?Lungs: clear to auscultation bilaterally ?Heart: regular rate and rhythm, S1, S2 normal, no murmur, click, rub or gallop ?Abdomen: soft, non-tender; bowel sounds normal; no masses,  no organomegaly ?Extremities: extremities normal, atraumatic, no cyanosis or edema ?Pulses: 2+ and symmetric ?Skin: Skin color, texture, turgor normal. No rashes or lesions ?Lymph nodes: Cervical, supraclavicular, and axillary nodes normal. ?Neurologic: Alert and oriented X 3, normal strength and tone. Normal symmetric reflexes. Normal coordination and gait  ?  ?Assessment:  ? ? Healthy female exam.    ?  ?Plan:  ? ? Ghm utd ?Check labs  ?See After Visit Summary for Counseling Recommendations  ? ? ?1. Preventative health care ?See above  ?- Comprehensive metabolic panel ?- CBC with Differential/Platelet ?- Lipid panel ?- TSH ? ?2. Hyperlipidemia, unspecified hyperlipidemia type ?Tolerating statin, encouraged heart healthy diet, avoid trans fats, minimize simple carbs and saturated fats. Increase exercise as tolerated  ?- Comprehensive metabolic panel ?- CBC with Differential/Platelet ?- Lipid panel ?- TSH ? ?3. Estrogen  deficiency ? ? ?- DG Bone Density; Future ? ?4. Palpitation ?Ekg ----sinus brady, non sp st changes  ?Refer to cardiology ?- EKG 12-Lead ?- Cardiac event monitor; Future ?- Ambulatory referral to Cardiology ? ?

## 2021-08-15 NOTE — Assessment & Plan Note (Signed)
ghm utd Check labs  See avs  

## 2021-08-15 NOTE — Patient Instructions (Signed)

## 2021-08-15 NOTE — Assessment & Plan Note (Signed)
Tolerating statin, encouraged heart healthy diet, avoid trans fats, minimize simple carbs and saturated fats. Increase exercise as tolerated 

## 2021-09-06 DIAGNOSIS — Z713 Dietary counseling and surveillance: Secondary | ICD-10-CM | POA: Diagnosis not present

## 2021-09-12 NOTE — Progress Notes (Signed)
Referring-Tanya Carollee Herter, DO Reason for referral-palpitations  HPI: 61 year old female for evaluation of palpitations at request of Roma Schanz DO.  Monitor January 2010 showed sinus rhythm with PVCs.  Laboratories April 2023 showed TSH 0.96, potassium 4.3, creatinine 1.03, normal liver functions and normal hemoglobin.  Patient has had intermittent palpitations for years by her report.  They are described as a brief flutter or jumping feeling.  They last seconds with no associated symptoms.  They are not sustained.  She otherwise denies dyspnea on exertion, orthopnea, PND, pedal edema, chest pain or syncope.  Cardiology now asked to evaluate.  Current Outpatient Medications  Medication Sig Dispense Refill   estradiol (CLIMARA - DOSED IN MG/24 HR) 0.05 mg/24hr patch APPLY 1 PATCH(0.05 MG) TOPICALLY TO THE SKIN 1 TIME A WEEK 12 patch 1   Multiple Vitamin (MULTIVITAMIN) tablet Take 1 tablet by mouth daily.     pravastatin (PRAVACHOL) 40 MG tablet TAKE 1 TABLET(40 MG) BY MOUTH DAILY 90 tablet 1   No current facility-administered medications for this visit.    No Known Allergies   Past Medical History:  Diagnosis Date   Acute lymphadenitis    Cancer (HCC)    Basal Cell--Face   Heart murmur    Hyperlipidemia     Past Surgical History:  Procedure Laterality Date   ABDOMINAL HYSTERECTOMY     MOHS SURGERY  08/2008   [facility]    Social History   Socioeconomic History   Marital status: Married    Spouse name: Not on file   Number of children: Not on file   Years of education: Not on file   Highest education level: Not on file  Occupational History   Occupation: [employer]    Employer: [employer]  Tobacco Use   Smoking status: Never   Smokeless tobacco: Never  Vaping Use   Vaping Use: Never used  Substance and Sexual Activity   Alcohol use: Yes    Comment: rare   Drug use: No   Sexual activity: Yes    Partners: Male  Other Topics Concern   Not on file  Social  History Narrative   Exercise-no   Social Determinants of Health   Financial Resource Strain: Not on file  Food Insecurity: Not on file  Transportation Needs: Not on file  Physical Activity: Not on file  Stress: Not on file  Social Connections: Not on file  Intimate Partner Violence: Not on file    Family History  Problem Relation Age of Onset   Hypertension Mother        Brain aneurysm   Aneurysm Mother    Prostate cancer Father    Hypertension Other    Breast cancer Neg Hx    Colon cancer Neg Hx    Colon polyps Neg Hx    Esophageal cancer Neg Hx    Rectal cancer Neg Hx    Stomach cancer Neg Hx     ROS: no fevers or chills, productive cough, hemoptysis, dysphasia, odynophagia, melena, hematochezia, dysuria, hematuria, rash, seizure activity, orthopnea, PND, pedal edema, claudication. Remaining systems are negative.  Physical Exam:   Blood pressure 120/81, pulse 68, height '5\' 8"'$  (1.727 m), weight 167 lb 12.8 oz (76.1 kg), SpO2 99 %.  General:  Well developed/well nourished in NAD Skin warm/dry Patient not depressed No peripheral clubbing Back-normal HEENT-normal/normal eyelids Neck supple/normal carotid upstroke bilaterally; no bruits; no JVD; no thyromegaly chest - CTA/ normal expansion CV - RRR/normal S1 and S2; no  murmurs, rubs or gallops;  PMI nondisplaced Abdomen -NT/ND, no HSM, no mass, + bowel sounds, no bruit 2+ femoral pulses, no bruits Ext-no edema, chords, 2+ DP Neuro-grossly nonfocal  ECG -August 15, 2021-sinus bradycardia with rare PVC personally reviewed  A/P  1 palpitations-etiology unclear but sounds likely to be premature atrial contractions or premature ventricular contractions.  We will arrange echocardiogram to assess LV function.  I will also arrange a Zio patch to further assess.  We can consider addition of beta-blockade in the future if necessary.  2 hyperlipidemia-continue statin.  3 history of murmur-I do not appreciate a murmur on  examination today.  However she is scheduled for an echocardiogram to assess LV function and this will rule out valvular disease as well.  Kirk Ruths, MD

## 2021-09-18 ENCOUNTER — Encounter: Payer: Self-pay | Admitting: Cardiology

## 2021-09-18 ENCOUNTER — Ambulatory Visit (INDEPENDENT_AMBULATORY_CARE_PROVIDER_SITE_OTHER): Payer: BC Managed Care – PPO

## 2021-09-18 ENCOUNTER — Ambulatory Visit: Payer: BC Managed Care – PPO | Admitting: Cardiology

## 2021-09-18 VITALS — BP 120/81 | HR 68 | Ht 68.0 in | Wt 167.8 lb

## 2021-09-18 DIAGNOSIS — R002 Palpitations: Secondary | ICD-10-CM

## 2021-09-18 DIAGNOSIS — E7800 Pure hypercholesterolemia, unspecified: Secondary | ICD-10-CM

## 2021-09-18 NOTE — Progress Notes (Unsigned)
Enrolled for [employer] to mail a ZIO XT long term holter monitor to the patients address on file.  

## 2021-09-18 NOTE — Patient Instructions (Signed)
Testing/Procedures:  Your physician has requested that you have an echocardiogram. Echocardiography is a painless test that uses sound waves to create images of your heart. It provides your doctor with information about the size and shape of your heart and how well your heart's chambers and valves are working. This procedure takes approximately one hour. There are no restrictions for this procedure. [facility]-1 ST FLOOR IMAGING DEPARTMENT   ZIO XT- Long Term Monitor Instructions  Your physician has requested you wear a ZIO patch monitor for 14 days.  This is a single patch monitor. [employer] supplies one patch monitor per enrollment. Additional stickers are not available. Please do not apply patch if you will be having a Nuclear Stress Test,  Echocardiogram, Cardiac CT, MRI, or Chest Xray during the period you would be wearing the  monitor. The patch cannot be worn during these tests. You cannot remove and re-apply the  ZIO XT patch monitor.  Your ZIO patch monitor will be mailed 3 day USPS to your address on file. It may take 3-5 days  to receive your monitor after you have been enrolled.  Once you have received your monitor, please review the enclosed instructions. Your monitor  has already been registered assigning a specific monitor serial # to you.  Billing and Patient Assistance Program Information  We have supplied [employer] with any of your insurance information on file for billing purposes. [employer] offers a sliding scale Patient Assistance Program for patients that do not have  insurance, or whose insurance does not completely cover the cost of the ZIO monitor.  You must apply for the Patient Assistance Program to qualify for this discounted rate.  To apply, please call [employer] at [phone], select option 4, select option 2, ask to apply for  Patient Assistance Program. Theodore Demark will ask your household income, and how many people  are in your household. They will quote your  out-of-pocket cost based on that information.  [employer] will also be able to set up a 44-month interest-free payment plan if needed.  Applying the monitor   Shave hair from upper left chest.  Hold abrader disc by orange tab. Rub abrader in 40 strokes over the upper left chest as  indicated in your monitor instructions.  Clean area with 4 enclosed alcohol pads. Let dry.  Apply patch as indicated in monitor instructions. Patch will be placed under collarbone on left  side of chest with arrow pointing upward.  Rub patch adhesive wings for 2 minutes. Remove white label marked "1". Remove the white  label marked "2". Rub patch adhesive wings for 2 additional minutes.  While looking in a mirror, press and release button in center of patch. A small green light will  flash 3-4 times. This will be your only indicator that the monitor has been turned on.  Do not shower for the first 24 hours. You may shower after the first 24 hours.  Press the button if you feel a symptom. You will hear a small click. Record Date, Time and  Symptom in the Patient Logbook.  When you are ready to remove the patch, follow instructions on the last 2 pages of Patient  Logbook. Stick patch monitor onto the last page of Patient Logbook.  Place Patient Logbook in the blue and white box. Use locking tab on box and tape box closed  securely. The blue and white box has prepaid postage on it. Please place it in the mailbox as  soon as possible. Your  physician should have your test results approximately 7 days after the  monitor has been mailed back to [facility].  Call [facility] at [phone] if you have questions regarding  your ZIO XT patch monitor. Call them immediately if you see an orange light blinking on your  monitor.  If your monitor falls off in less than 4 days, contact our Monitor department at [phone].  If your monitor becomes loose or falls off after 4 days call [employer] at  [phone] for  suggestions on securing your monitor    Follow-Up: At [facility], you and your health needs are our priority.  As part of our continuing mission to provide you with exceptional heart care, we have created designated Provider Care Teams.  These Care Teams include your primary Cardiologist (physician) and Advanced Practice Providers (APPs -  Physician Assistants and Nurse Practitioners) who all work together to provide you with the care you need, when you need it.  We recommend signing up for the patient portal called "MyChart".  Sign up information is provided on this After Visit Summary.  MyChart is used to connect with patients for Virtual Visits (Telemedicine).  Patients are able to view lab/test results, encounter notes, upcoming appointments, etc.  Non-urgent messages can be sent to your provider as well.   To learn more about what you can do with MyChart, go to [email or web address].    Your next appointment:   4-6 month(s)  The format for your next appointment:   In Person  Provider:   Kirk Ruths, MD      Important Information About Sugar

## 2021-09-20 ENCOUNTER — Ambulatory Visit (HOSPITAL_BASED_OUTPATIENT_CLINIC_OR_DEPARTMENT_OTHER): Payer: BC Managed Care – PPO

## 2021-10-14 ENCOUNTER — Ambulatory Visit (HOSPITAL_BASED_OUTPATIENT_CLINIC_OR_DEPARTMENT_OTHER)
Admission: RE | Admit: 2021-10-14 | Discharge: 2021-10-14 | Disposition: A | Discharge status: Home / Self Care | Payer: BC Managed Care – PPO | Source: Ambulatory Visit | Attending: Cardiology | Admitting: Cardiology

## 2021-10-14 DIAGNOSIS — R002 Palpitations: Secondary | ICD-10-CM | POA: Diagnosis not present

## 2021-10-14 DIAGNOSIS — I340 Nonrheumatic mitral (valve) insufficiency: Secondary | ICD-10-CM

## 2021-10-14 LAB — ECHOCARDIOGRAM COMPLETE
AR max vel: 1.71 cm2
AV Area VTI: 1.67 cm2
AV Area mean vel: 1.67 cm2
AV Mean grad: 5 mmHg
AV Peak grad: 9.2 mmHg
Ao pk vel: 1.52 m/s
Area-P 1/2: 4.41 cm2
S' Lateral: 3 cm

## 2021-10-15 DIAGNOSIS — R002 Palpitations: Secondary | ICD-10-CM | POA: Diagnosis not present

## 2021-11-04 ENCOUNTER — Other Ambulatory Visit: Payer: Self-pay | Admitting: Family Medicine

## 2021-11-13 ENCOUNTER — Other Ambulatory Visit: Payer: Self-pay | Admitting: Family Medicine

## 2021-11-13 DIAGNOSIS — Z1231 Encounter for screening mammogram for malignant neoplasm of breast: Secondary | ICD-10-CM

## 2021-11-29 ENCOUNTER — Other Ambulatory Visit: Payer: Self-pay | Admitting: Family Medicine

## 2021-11-29 DIAGNOSIS — E785 Hyperlipidemia, unspecified: Secondary | ICD-10-CM

## 2021-12-11 ENCOUNTER — Ambulatory Visit
Admission: RE | Admit: 2021-12-11 | Discharge: 2021-12-11 | Disposition: A | Discharge status: Home / Self Care | Payer: BC Managed Care – PPO | Source: Ambulatory Visit | Attending: Family Medicine | Admitting: Family Medicine

## 2021-12-11 DIAGNOSIS — Z1231 Encounter for screening mammogram for malignant neoplasm of breast: Secondary | ICD-10-CM | POA: Diagnosis not present

## 2022-02-05 NOTE — Progress Notes (Signed)
     HPI: FU palpitations. Laboratories April 2023 showed TSH 0.96.  Patient has had intermittent palpitations for years by her report. Echocardiogram June 2023 showed normal LV function, mild mitral regurgitation.  Monitor July 2023 showed sinus rhythm with 4 and 5 beat runs of PAT, occasional PAC and PVC.  Since last seen she denies dyspnea, chest pain or syncope.  Her palpitations have improved with only an occasional brief flutter.  Current Outpatient Medications  Medication Sig Dispense Refill   estradiol (CLIMARA - DOSED IN MG/24 HR) 0.05 mg/24hr patch APPLY 1 PATCH(0.05 MG) TOPICALLY TO THE SKIN 1 TIME A WEEK 12 patch 1   Multiple Vitamin (MULTIVITAMIN) tablet Take 1 tablet by mouth daily.     pravastatin (PRAVACHOL) 40 MG tablet TAKE 1 TABLET(40 MG) BY MOUTH DAILY 90 tablet 1   No current facility-administered medications for this visit.     Past Medical History:  Diagnosis Date   Acute lymphadenitis    Cancer ([place])    Basal Cell--Face   Heart murmur    Hyperlipidemia     Past Surgical History:  Procedure Laterality Date   ABDOMINAL HYSTERECTOMY     MOHS SURGERY  08/2008   [facility]    Social History   Socioeconomic History   Marital status: Married    Spouse name: Not on file   Number of children: Not on file   Years of education: Not on file   Highest education level: Not on file  Occupational History   Occupation: [employer]    Employer: [employer]  Tobacco Use   Smoking status: Never   Smokeless tobacco: Never  Vaping Use   Vaping Use: Never used  Substance and Sexual Activity   Alcohol use: Yes    Comment: rare   Drug use: No   Sexual activity: Yes    Partners: Male  Other Topics Concern   Not on file  Social History Narrative   Exercise-no   Social Determinants of Health   Financial Resource Strain: Not on file  Food Insecurity: Not on file  Transportation Needs: Not on file  Physical Activity: Not on file  Stress: Not on file  Social  Connections: Not on file  Intimate Partner Violence: Not on file    Family History  Problem Relation Age of Onset   Hypertension Mother        Brain aneurysm   Aneurysm Mother    Prostate cancer Father    Hypertension Other    Breast cancer Neg Hx    Colon cancer Neg Hx    Colon polyps Neg Hx    Esophageal cancer Neg Hx    Rectal cancer Neg Hx    Stomach cancer Neg Hx     ROS: no fevers or chills, productive cough, hemoptysis, dysphasia, odynophagia, melena, hematochezia, dysuria, hematuria, rash, seizure activity, orthopnea, PND, pedal edema, claudication. Remaining systems are negative.  Physical Exam: Well-developed well-nourished in no acute distress.  Skin is warm and dry.  HEENT is normal.  Neck is supple.  Chest is clear to auscultation with normal expansion.  Cardiovascular exam is regular rate and rhythm.  Abdominal exam nontender or distended. No masses palpated. Extremities show no edema. neuro grossly intact   A/P  1 palpitations-echocardiogram showed normal LV function.  Monitor showed PACs, PVCs and brief PAT. Her symptoms have improved.  We will consider addition of beta-blockade in the future if necessary.  2 hyperlipidemia-continue statin.  Kirk Ruths, MD

## 2022-02-14 ENCOUNTER — Encounter: Payer: Self-pay | Admitting: Family Medicine

## 2022-02-14 ENCOUNTER — Ambulatory Visit: Payer: BC Managed Care – PPO | Admitting: Family Medicine

## 2022-02-14 VITALS — BP 110/82 | HR 65 | Temp 98.1°F | Resp 18 | Ht 68.0 in | Wt 167.4 lb

## 2022-02-14 DIAGNOSIS — E785 Hyperlipidemia, unspecified: Secondary | ICD-10-CM

## 2022-02-14 DIAGNOSIS — R200 Anesthesia of skin: Secondary | ICD-10-CM

## 2022-02-14 LAB — COMPREHENSIVE METABOLIC PANEL
ALT: 18 U/L (ref 0–35)
AST: 21 U/L (ref 0–37)
Albumin: 4.3 g/dL (ref 3.5–5.2)
Alkaline Phosphatase: 53 U/L (ref 39–117)
BUN: 19 mg/dL (ref 6–23)
CO2: 30 mEq/L (ref 19–32)
Calcium: 9.3 mg/dL (ref 8.4–10.5)
Chloride: 102 mEq/L (ref 96–112)
Creatinine, Ser: 1.09 mg/dL (ref 0.40–1.20)
GFR: 54.12 mL/min — ABNORMAL LOW (ref >60.00)
Glucose, Bld: 89 mg/dL (ref 70–99)
Potassium: 4.7 mEq/L (ref 3.5–5.1)
Sodium: 137 mEq/L (ref 135–145)
Total Bilirubin: 0.5 mg/dL (ref 0.2–1.2)
Total Protein: 7.1 g/dL (ref 6.0–8.3)

## 2022-02-14 LAB — CBC WITH DIFFERENTIAL/PLATELET
Basophils Absolute: 0 10*3/uL (ref 0.0–0.1)
Basophils Relative: 0.7 % (ref 0.0–3.0)
Eosinophils Absolute: 0.1 10*3/uL (ref 0.0–0.7)
Eosinophils Relative: 1.6 % (ref 0.0–5.0)
HCT: 41.7 % (ref 36.0–46.0)
Hemoglobin: 13.9 g/dL (ref 12.0–15.0)
Lymphocytes Relative: 36.4 % (ref 12.0–46.0)
Lymphs Abs: 1.5 10*3/uL (ref 0.7–4.0)
MCHC: 33.4 g/dL (ref 30.0–36.0)
MCV: 91.8 fl (ref 78.0–100.0)
Monocytes Absolute: 0.5 10*3/uL (ref 0.1–1.0)
Monocytes Relative: 11.3 % (ref 3.0–12.0)
Neutro Abs: 2.1 10*3/uL (ref 1.4–7.7)
Neutrophils Relative %: 50 % (ref 43.0–77.0)
Platelets: 222 10*3/uL (ref 150.0–400.0)
RBC: 4.54 Mil/uL (ref 3.87–5.11)
RDW: 12.6 % (ref 11.5–15.5)
WBC: 4.3 10*3/uL (ref 4.0–10.5)

## 2022-02-14 LAB — LIPID PANEL
Cholesterol: 135 mg/dL (ref 0–200)
HDL: 44.1 mg/dL (ref >39.00)
LDL Cholesterol: 74 mg/dL (ref 0–99)
NonHDL: 90.48
Total CHOL/HDL Ratio: 3
Triglycerides: 82 mg/dL (ref 0.0–149.0)
VLDL: 16.4 mg/dL (ref 0.0–40.0)

## 2022-02-14 LAB — VITAMIN B12: Vitamin B-12: 636 pg/mL (ref 211–911)

## 2022-02-14 NOTE — Assessment & Plan Note (Signed)
Tolerating statin, encouraged heart healthy diet, avoid trans fats, minimize simple carbs and saturated fats. Increase exercise as tolerated 

## 2022-02-14 NOTE — Patient Instructions (Signed)

## 2022-02-14 NOTE — Progress Notes (Addendum)
Subjective:   By signing my name below, I, Roma Schanz, attest that this documentation has been prepared under the direction and in the presence of Roma Schanz, 02/14/2022.   Patient ID: Tanya Nelson, female    DOB: 09-06-1960, 61 y.o.   MRN: [id number]  Chief Complaint  Patient presents with   Hyperlipidemia   Follow-up    HPI Patient is in today for an office visit.  Toes She reports that she experiences a tingle and slight numbness on her 2nd and 3rd toes on her right foot. She states it may be due to her standing on her feet for extended period of times.  Blood Pressure Patients blood pressure is normal this visit.She denies any swelling. BP Readings from Last 3 Encounters:  02/14/22 110/82  09/18/21 120/81  08/15/21 118/86   Pulse Readings from Last 3 Encounters:  02/14/22 65  09/18/21 68  08/15/21 68   Exercise- Patient reports that she does not participate in regular exercise.   There are no preventive care reminders to display for this patient.   Past Medical History:  Diagnosis Date   Acute lymphadenitis    Cancer ([facility])    Basal Cell--Face   Heart murmur    Hyperlipidemia     Past Surgical History:  Procedure Laterality Date   ABDOMINAL HYSTERECTOMY     MOHS SURGERY  08/2008   BCC    Family History  Problem Relation Age of Onset   Hypertension Mother        Brain aneurysm   Aneurysm Mother    Prostate cancer Father    Hypertension Other    Breast cancer Neg Hx    Colon cancer Neg Hx    Colon polyps Neg Hx    Esophageal cancer Neg Hx    Rectal cancer Neg Hx    Stomach cancer Neg Hx     Social History   Socioeconomic History   Marital status: Married    Spouse name: Not on file   Number of children: Not on file   Years of education: Not on file   Highest education level: Not on file  Occupational History   Occupation: [employer]    Employer: [employer]  Tobacco Use   Smoking status: Never   Smokeless tobacco:  Never  Vaping Use   Vaping Use: Never used  Substance and Sexual Activity   Alcohol use: Yes    Comment: rare   Drug use: No   Sexual activity: Yes    Partners: Male  Other Topics Concern   Not on file  Social History Narrative   Exercise-no   Social Determinants of Health   Financial Resource Strain: Not on file  Food Insecurity: Not on file  Transportation Needs: Not on file  Physical Activity: Not on file  Stress: Not on file  Social Connections: Not on file  Intimate Partner Violence: Not on file    Outpatient Medications Prior to Visit  Medication Sig Dispense Refill   estradiol (CLIMARA - DOSED IN MG/24 HR) 0.05 mg/24hr patch APPLY 1 PATCH(0.05 MG) TOPICALLY TO THE SKIN 1 TIME A WEEK 12 patch 1   Multiple Vitamin (MULTIVITAMIN) tablet Take 1 tablet by mouth daily.     pravastatin (PRAVACHOL) 40 MG tablet TAKE 1 TABLET(40 MG) BY MOUTH DAILY 90 tablet 1   No facility-administered medications prior to visit.    No Known Allergies  Review of Systems  Constitutional:  Negative for fever and malaise/fatigue.  HENT:  Negative for congestion.   Eyes:  Negative for blurred vision.  Respiratory:  Negative for cough and shortness of breath.   Cardiovascular:  Negative for chest pain, palpitations and leg swelling.  Gastrointestinal:  Negative for vomiting.  Musculoskeletal:  Negative for back pain.       (+) numbness in 2nd and 3rd toes on right foot.  Skin:  Negative for rash.  Neurological:  Negative for loss of consciousness and headaches.       Objective:    Physical Exam Vitals and nursing note reviewed.  Constitutional:      General: She is not in acute distress.    Appearance: Normal appearance. She is not ill-appearing.  HENT:     Head: Normocephalic and atraumatic.     Right Ear: External ear normal.     Left Ear: External ear normal.  Eyes:     Extraocular Movements: Extraocular movements intact.     Pupils: Pupils are equal, round, and reactive to  light.  Cardiovascular:     Rate and Rhythm: Normal rate and regular rhythm.     Heart sounds: Normal heart sounds. No murmur heard.    No gallop.  Pulmonary:     Effort: Pulmonary effort is normal. No respiratory distress.     Breath sounds: Normal breath sounds. No wheezing or rales.  Skin:    General: Skin is warm and dry.  Neurological:     Mental Status: She is alert and oriented to person, place, and time.  Psychiatric:        Judgment: Judgment normal.     BP 110/82 (BP Location: Left Arm, Patient Position: Sitting, Cuff Size: Normal)   Pulse 65   Temp 98.1 F (36.7 C) (Oral)   Resp 18   Ht '5\' 8"'$  (1.727 m)   Wt 167 lb 6.4 oz (75.9 kg)   SpO2 98%   BMI 25.45 kg/m  Wt Readings from Last 3 Encounters:  02/14/22 167 lb 6.4 oz (75.9 kg)  09/18/21 167 lb 12.8 oz (76.1 kg)  08/15/21 167 lb (75.8 kg)       Assessment & Plan:   Problem List Items Addressed This Visit       Unprioritized   Hyperlipidemia - Primary    Tolerating statin, encouraged heart healthy diet, avoid trans fats, minimize simple carbs and saturated fats. Increase exercise as tolerated      Relevant Orders   Comprehensive metabolic panel   Lipid panel   Other Visit Diagnoses     Numbness of toes       Relevant Orders   Vitamin B12   CBC with Differential/Platelet      No orders of the defined types were placed in this encounter.   I, Roma Schanz, personally preformed the services described in this documentation.  All medical record entries made by the scribe were at my direction and in my presence.  I have reviewed the chart and discharge instructions (if applicable) and agree that the record reflects my personal performance and is accurate and complete. 02/14/2022.   I,Verona Buck,acting as a Education administrator for [employer], DO.,have documented all relevant documentation on the behalf of Ann Held, DO,as directed by  Ann Held, DO while in the presence of  Ann Held, DO.    Ann Held, DO

## 2022-02-19 ENCOUNTER — Encounter: Payer: Self-pay | Admitting: Cardiology

## 2022-02-19 ENCOUNTER — Ambulatory Visit: Payer: BC Managed Care – PPO | Attending: Cardiology | Admitting: Cardiology

## 2022-02-19 VITALS — BP 118/64 | HR 64 | Ht 68.0 in | Wt 169.1 lb

## 2022-02-19 DIAGNOSIS — R002 Palpitations: Secondary | ICD-10-CM | POA: Diagnosis not present

## 2022-02-19 DIAGNOSIS — E7800 Pure hypercholesterolemia, unspecified: Secondary | ICD-10-CM

## 2022-02-19 NOTE — Patient Instructions (Signed)
  Follow-Up: At [facility], you and your health needs are our priority.  As part of our continuing mission to provide you with exceptional heart care, we have created designated Provider Care Teams.  These Care Teams include your primary Cardiologist (physician) and Advanced Practice Providers (APPs -  Physician Assistants and Nurse Practitioners) who all work together to provide you with the care you need, when you need it.  We recommend signing up for the patient portal called "MyChart".  Sign up information is provided on this After Visit Summary.  MyChart is used to connect with patients for Virtual Visits (Telemedicine).  Patients are able to view lab/test results, encounter notes, upcoming appointments, etc.  Non-urgent messages can be sent to your provider as well.   To learn more about what you can do with MyChart, go to [email or web address].    Your next appointment:   12 month(s)  The format for your next appointment:   In Person  Provider:   Brian Crenshaw, MD   

## 2022-04-22 ENCOUNTER — Other Ambulatory Visit: Payer: Self-pay | Admitting: Family Medicine

## 2022-05-02 ENCOUNTER — Ambulatory Visit
Admission: RE | Admit: 2022-05-02 | Discharge: 2022-05-02 | Disposition: A | Discharge status: Home / Self Care | Payer: BC Managed Care – PPO | Source: Ambulatory Visit | Attending: Family Medicine | Admitting: Family Medicine

## 2022-05-02 DIAGNOSIS — E2839 Other primary ovarian failure: Secondary | ICD-10-CM

## 2022-05-02 DIAGNOSIS — Z780 Asymptomatic menopausal state: Secondary | ICD-10-CM | POA: Diagnosis not present

## 2022-05-29 ENCOUNTER — Other Ambulatory Visit: Payer: Self-pay | Admitting: Family Medicine

## 2022-05-29 DIAGNOSIS — E785 Hyperlipidemia, unspecified: Secondary | ICD-10-CM

## 2022-08-22 ENCOUNTER — Encounter: Payer: Self-pay | Admitting: Family Medicine

## 2022-08-22 ENCOUNTER — Ambulatory Visit (INDEPENDENT_AMBULATORY_CARE_PROVIDER_SITE_OTHER): Payer: BC Managed Care – PPO | Admitting: Family Medicine

## 2022-08-22 VITALS — BP 100/70 | HR 63 | Temp 97.8°F | Resp 18 | Ht 68.0 in | Wt 167.0 lb

## 2022-08-22 DIAGNOSIS — M25562 Pain in left knee: Secondary | ICD-10-CM | POA: Diagnosis not present

## 2022-08-22 DIAGNOSIS — Z0000 Encounter for general adult medical examination without abnormal findings: Secondary | ICD-10-CM | POA: Diagnosis not present

## 2022-08-22 DIAGNOSIS — M25561 Pain in right knee: Secondary | ICD-10-CM

## 2022-08-22 DIAGNOSIS — G8929 Other chronic pain: Secondary | ICD-10-CM | POA: Insufficient documentation

## 2022-08-22 DIAGNOSIS — E785 Hyperlipidemia, unspecified: Secondary | ICD-10-CM | POA: Diagnosis not present

## 2022-08-22 LAB — LIPID PANEL
Cholesterol: 137 mg/dL (ref 0–200)
HDL: 46.7 mg/dL (ref >39.00)
LDL Cholesterol: 70 mg/dL (ref 0–99)
NonHDL: 90.64
Total CHOL/HDL Ratio: 3
Triglycerides: 102 mg/dL (ref 0.0–149.0)
VLDL: 20.4 mg/dL (ref 0.0–40.0)

## 2022-08-22 LAB — TSH: TSH: 0.75 u[IU]/mL (ref 0.35–5.50)

## 2022-08-22 LAB — CBC WITH DIFFERENTIAL/PLATELET
Basophils Absolute: 0 10*3/uL (ref 0.0–0.1)
Basophils Relative: 0.9 % (ref 0.0–3.0)
Eosinophils Absolute: 0.1 10*3/uL (ref 0.0–0.7)
Eosinophils Relative: 2.2 % (ref 0.0–5.0)
HCT: 41.8 % (ref 36.0–46.0)
Hemoglobin: 14.3 g/dL (ref 12.0–15.0)
Lymphocytes Relative: 39.2 % (ref 12.0–46.0)
Lymphs Abs: 1.7 10*3/uL (ref 0.7–4.0)
MCHC: 34.2 g/dL (ref 30.0–36.0)
MCV: 91.4 fl (ref 78.0–100.0)
Monocytes Absolute: 0.6 10*3/uL (ref 0.1–1.0)
Monocytes Relative: 12.8 % — ABNORMAL HIGH (ref 3.0–12.0)
Neutro Abs: 2 10*3/uL (ref 1.4–7.7)
Neutrophils Relative %: 44.9 % (ref 43.0–77.0)
Platelets: 207 10*3/uL (ref 150.0–400.0)
RBC: 4.57 Mil/uL (ref 3.87–5.11)
RDW: 13 % (ref 11.5–15.5)
WBC: 4.4 10*3/uL (ref 4.0–10.5)

## 2022-08-22 LAB — COMPREHENSIVE METABOLIC PANEL
ALT: 20 U/L (ref 0–35)
AST: 22 U/L (ref 0–37)
Albumin: 4.2 g/dL (ref 3.5–5.2)
Alkaline Phosphatase: 51 U/L (ref 39–117)
BUN: 16 mg/dL (ref 6–23)
CO2: 29 mEq/L (ref 19–32)
Calcium: 9.5 mg/dL (ref 8.4–10.5)
Chloride: 101 mEq/L (ref 96–112)
Creatinine, Ser: 1.04 mg/dL (ref 0.40–1.20)
GFR: 57.04 mL/min — ABNORMAL LOW (ref >60.00)
Glucose, Bld: 87 mg/dL (ref 70–99)
Potassium: 4.4 mEq/L (ref 3.5–5.1)
Sodium: 138 mEq/L (ref 135–145)
Total Bilirubin: 0.6 mg/dL (ref 0.2–1.2)
Total Protein: 7.3 g/dL (ref 6.0–8.3)

## 2022-08-22 NOTE — Progress Notes (Signed)
Established Patient Office Visit  Subjective   Patient ID: Tanya Nelson, female    DOB: [birth year]  Age: 62 y.o. MRN: [id number]  Chief Complaint  Patient presents with   Annual Exam    Pt states fasting     HPI  Patient Active Problem List   Diagnosis Date Noted   Chronic pain of both knees 08/22/2022   Chest pain 07/30/2020   Preventative health care 07/01/2019   Acute pain of left shoulder 03/23/2017   THYROID NODULE 05/24/2010   Basal cell carcinoma, face 04/24/2009   Hyperlipidemia 04/24/2009   Asymptomatic postmenopausal status 04/24/2009   Malignant neoplasm of skin of face 04/24/2009   ACUTE LYMPHADENITIS 05/17/2008   OTHER ACNE 05/17/2008   MOLE 03/31/2008   PALPITATIONS, OCCASIONAL 03/31/2008   CARDIAC MURMUR, HX OF 02/23/2007   WISDOM TEETH EXTRACTION, HX OF 02/23/2007   Past Medical History:  Diagnosis Date   Acute lymphadenitis    Cancer ([facility])    Basal Cell--Face   Heart murmur    Hyperlipidemia    Past Surgical History:  Procedure Laterality Date   ABDOMINAL HYSTERECTOMY     MOHS SURGERY  08/2008   BCC   Social History   Tobacco Use   Smoking status: Never   Smokeless tobacco: Never  Vaping Use   Vaping Use: Never used  Substance Use Topics   Alcohol use: Yes    Comment: rare   Drug use: No   Social History   Socioeconomic History   Marital status: Married    Spouse name: Not on file   Number of children: Not on file   Years of education: Not on file   Highest education level: Not on file  Occupational History   Occupation: [employer]    Employer: [employer]  Tobacco Use   Smoking status: Never   Smokeless tobacco: Never  Vaping Use   Vaping Use: Never used  Substance and Sexual Activity   Alcohol use: Yes    Comment: rare   Drug use: No   Sexual activity: Yes    Partners: Male  Other Topics Concern   Not on file  Social History Narrative   Exercise-no   Social Determinants of Health   Financial Resource Strain:  Not on file  Food Insecurity: Not on file  Transportation Needs: Not on file  Physical Activity: Not on file  Stress: Not on file  Social Connections: Not on file  Intimate Partner Violence: Not on file   Family Status  Relation Name Status   Mother  Deceased at age 48       blot clot, aneurysm   Father 14 Deceased   Sister  Armed forces training and education officer   Sister  Alive   Sister  Alive   Sister  Alive   Other  (Not Specified)   Neg Hx  (Not Specified)   Family History  Problem Relation Age of Onset   Hypertension Mother        Brain aneurysm   Aneurysm Mother    Prostate cancer Father    Hypertension Other    Breast cancer Neg Hx    Colon cancer Neg Hx    Colon polyps Neg Hx    Esophageal cancer Neg Hx    Rectal cancer Neg Hx    Stomach cancer Neg Hx    No Known Allergies    Review of Systems  Constitutional:  Negative for fever and malaise/fatigue.  HENT:  Negative for congestion and hearing loss.  Eyes:  Negative for blurred vision and pain.  Respiratory:  Negative for cough, sputum production and shortness of breath.   Cardiovascular:  Negative for chest pain, palpitations and leg swelling.  Gastrointestinal:  Negative for abdominal pain, blood in stool and nausea.  Genitourinary:  Negative for dysuria and frequency.  Musculoskeletal:  Positive for joint pain. Negative for falls.       Occassional b/l knee pain when she walks / hikes a lot  Skin:  Negative for rash.  Neurological:  Negative for dizziness, loss of consciousness and headaches.  Endo/Heme/Allergies:  Negative for environmental allergies.  Psychiatric/Behavioral:  Negative for depression. The patient is not nervous/anxious.       Objective:     BP 100/70 (BP Location: Left Arm, Patient Position: Sitting, Cuff Size: Normal)   Pulse 63   Temp 97.8 F (36.6 C) (Oral)   Resp 18   Ht 5\' 8"  (1.727 m)   Wt 167 lb (75.8 kg)   SpO2 98%   BMI 25.39 kg/m  BP Readings from Last 3 Encounters:  08/22/22 100/70   02/19/22 118/64  02/14/22 110/82   Wt Readings from Last 3 Encounters:  08/22/22 167 lb (75.8 kg)  02/19/22 169 lb 1.9 oz (76.7 kg)  02/14/22 167 lb 6.4 oz (75.9 kg)   SpO2 Readings from Last 3 Encounters:  08/22/22 98%  02/19/22 95%  02/14/22 98%      Physical Exam Vitals and nursing note reviewed.  Constitutional:      General: She is not in acute distress.    Appearance: She is well-developed.  HENT:     Right Ear: External ear normal.     Left Ear: External ear normal.     Nose: Nose normal.  Eyes:     Pupils: Pupils are equal, round, and reactive to light.  Cardiovascular:     Rate and Rhythm: Normal rate and regular rhythm.     Heart sounds: Normal heart sounds. No murmur heard. Pulmonary:     Effort: Pulmonary effort is normal. No respiratory distress.     Breath sounds: Normal breath sounds. No wheezing or rales.  Chest:     Chest wall: No tenderness.  Abdominal:     General: Abdomen is flat.     Palpations: Abdomen is soft.     Tenderness: There is no abdominal tenderness. There is no guarding or rebound.  Genitourinary:    Comments: S/p TAH Musculoskeletal:        General: No swelling or tenderness. Normal range of motion.     Cervical back: Normal range of motion and neck supple.  Skin:    Findings: No erythema, lesion or rash.  Neurological:     General: No focal deficit present.     Mental Status: She is alert and oriented to person, place, and time.     Motor: No weakness.     Gait: Gait normal.  Psychiatric:        Behavior: Behavior normal.        Thought Content: Thought content normal.        Judgment: Judgment normal.      No results found for any visits on 08/22/22.  Last CBC Lab Results  Component Value Date   WBC 4.3 02/14/2022   HGB 13.9 02/14/2022   HCT 41.7 02/14/2022   MCV 91.8 02/14/2022   RDW 12.6 02/14/2022   PLT 222.0 02/14/2022   Last metabolic panel Lab Results  Component Value Date  GLUCOSE 89 02/14/2022    NA 137 02/14/2022   K 4.7 02/14/2022   CL 102 02/14/2022   CO2 30 02/14/2022   BUN 19 02/14/2022   CREATININE 1.09 02/14/2022   CALCIUM 9.3 02/14/2022   PROT 7.1 02/14/2022   ALBUMIN 4.3 02/14/2022   BILITOT 0.5 02/14/2022   ALKPHOS 53 02/14/2022   AST 21 02/14/2022   ALT 18 02/14/2022   Last lipids Lab Results  Component Value Date   CHOL 135 02/14/2022   HDL 44.10 02/14/2022   LDLCALC 74 02/14/2022   LDLDIRECT 158.7 03/31/2008   TRIG 82.0 02/14/2022   CHOLHDL 3 02/14/2022   Last hemoglobin A1c No results found for: "HGBA1C" Last thyroid functions Lab Results  Component Value Date   TSH 0.96 08/15/2021   Last vitamin D Lab Results  Component Value Date   VD25OH 48 03/10/2011   Last vitamin B12 and Folate Lab Results  Component Value Date   VITAMINB12 636 02/14/2022      The 10-year ASCVD risk score (Arnett DK, et al., 2019) is: 2.5%    Assessment & Plan:   Problem List Items Addressed This Visit       Unprioritized   Preventative health care - Primary    Ghm utd Check labs  See AVS Health Maintenance  Topic Date Due   COVID-19 Vaccine (6 - 2023-24 season) 09/07/2022 (Originally 03/15/2022)   INFLUENZA VACCINE  11/20/2022   MAMMOGRAM  12/12/2022   DTaP/Tdap/Td (4 - Td or Tdap) 06/30/2029   COLONOSCOPY (Pts 45-34yrs Insurance coverage will need to be confirmed)  11/28/2030   Hepatitis C Screening  Completed   HIV Screening  Completed   Zoster Vaccines- Shingrix  Completed   HPV VACCINES  Aged Out        Relevant Orders   CBC with Differential/Platelet   Comprehensive metabolic panel   Lipid panel   TSH   Hyperlipidemia    Tolerating statin, encouraged heart healthy diet, avoid trans fats, minimize simple carbs and saturated fats. Increase exercise as tolerated       Relevant Orders   Comprehensive metabolic panel   Lipid panel   Chronic pain of both knees    Voltaren gel otc Tylenol arthritis or ibuprofen Can also try tumeric or  cosequin        Return in about 1 year (around 08/22/2023), or if symptoms worsen or fail to improve, for annual exam, fasting.    Donato Schultz, DO

## 2022-08-22 NOTE — Assessment & Plan Note (Signed)
Tolerating statin, encouraged heart healthy diet, avoid trans fats, minimize simple carbs and saturated fats. Increase exercise as tolerated 

## 2022-08-22 NOTE — Assessment & Plan Note (Signed)
Ghm utd Check labs  See AVS Health Maintenance  Topic Date Due   COVID-19 Vaccine (6 - 2023-24 season) 09/07/2022 (Originally 03/15/2022)   INFLUENZA VACCINE  11/20/2022   MAMMOGRAM  12/12/2022   DTaP/Tdap/Td (4 - Td or Tdap) 06/30/2029   COLONOSCOPY (Pts 45-47yrs Insurance coverage will need to be confirmed)  11/28/2030   Hepatitis C Screening  Completed   HIV Screening  Completed   Zoster Vaccines- Shingrix  Completed   HPV VACCINES  Aged Out

## 2022-08-22 NOTE — Assessment & Plan Note (Signed)
Voltaren gel otc Tylenol arthritis or ibuprofen Can also try tumeric or cosequin

## 2022-08-22 NOTE — Patient Instructions (Signed)
Preventive Care 40-64 Years Old, Female Preventive care refers to lifestyle choices and visits with your health care provider that can promote health and wellness. Preventive care visits are also called wellness exams. What can I expect for my preventive care visit? Counseling Your health care provider may ask you questions about your: Medical history, including: Past medical problems. Family medical history. Pregnancy history. Current health, including: Menstrual cycle. Method of birth control. Emotional well-being. Home life and relationship well-being. Sexual activity and sexual health. Lifestyle, including: Alcohol, nicotine or tobacco, and drug use. Access to firearms. Diet, exercise, and sleep habits. Work and work environment. Sunscreen use. Safety issues such as seatbelt and bike helmet use. Physical exam Your health care provider will check your: Height and weight. These may be used to calculate your BMI (body mass index). BMI is a measurement that tells if you are at a healthy weight. Waist circumference. This measures the distance around your waistline. This measurement also tells if you are at a healthy weight and may help predict your risk of certain diseases, such as type 2 diabetes and high blood pressure. Heart rate and blood pressure. Body temperature. Skin for abnormal spots. What immunizations do I need?  Vaccines are usually given at various ages, according to a schedule. Your health care provider will recommend vaccines for you based on your age, medical history, and lifestyle or other factors, such as travel or where you work. What tests do I need? Screening Your health care provider may recommend screening tests for certain conditions. This may include: Lipid and cholesterol levels. Diabetes screening. This is done by checking your blood sugar (glucose) after you have not eaten for a while (fasting). Pelvic exam and Pap test. Hepatitis B test. Hepatitis C  test. HIV (human immunodeficiency virus) test. STI (sexually transmitted infection) testing, if you are at risk. Lung cancer screening. Colorectal cancer screening. Mammogram. Talk with your health care provider about when you should start having regular mammograms. This may depend on whether you have a family history of breast cancer. BRCA-related cancer screening. This may be done if you have a family history of breast, ovarian, tubal, or peritoneal cancers. Bone density scan. This is done to screen for osteoporosis. Talk with your health care provider about your test results, treatment options, and if necessary, the need for more tests. Follow these instructions at home: Eating and drinking  Eat a diet that includes fresh fruits and vegetables, whole grains, lean protein, and low-fat dairy products. Take vitamin and mineral supplements as recommended by your health care provider. Do not drink alcohol if: Your health care provider tells you not to drink. You are pregnant, may be pregnant, or are planning to become pregnant. If you drink alcohol: Limit how much you have to 0-1 drink a day. Know how much alcohol is in your drink. In the [place] one drink equals one 12 oz bottle of beer (355 mL), one 5 oz glass of wine (148 mL), or one 1 oz glass of hard liquor (44 mL). Lifestyle Brush your teeth every morning and night with fluoride toothpaste. Floss one time each day. Exercise for at least 30 minutes 5 or more days each week. Do not use any products that contain nicotine or tobacco. These products include cigarettes, chewing tobacco, and vaping devices, such as e-cigarettes. If you need help quitting, ask your health care provider. Do not use drugs. If you are sexually active, practice safe sex. Use a condom or other form of protection to   prevent STIs. If you do not wish to become pregnant, use a form of birth control. If you plan to become pregnant, see your health care provider for a  prepregnancy visit. Take aspirin only as told by your health care provider. Make sure that you understand how much to take and what form to take. Work with your health care provider to find out whether it is safe and beneficial for you to take aspirin daily. Find healthy ways to manage stress, such as: Meditation, yoga, or listening to music. Journaling. Talking to a trusted person. Spending time with friends and family. Minimize exposure to UV radiation to reduce your risk of skin cancer. Safety Always wear your seat belt while driving or riding in a vehicle. Do not drive: If you have been drinking alcohol. Do not ride with someone who has been drinking. When you are tired or distracted. While texting. If you have been using any mind-altering substances or drugs. Wear a helmet and other protective equipment during sports activities. If you have firearms in your house, make sure you follow all gun safety procedures. Seek help if you have been physically or sexually abused. What's next? Visit your health care provider once a year for an annual wellness visit. Ask your health care provider how often you should have your eyes and teeth checked. Stay up to date on all vaccines. This information is not intended to replace advice given to you by your health care provider. Make sure you discuss any questions you have with your health care provider. Document Revised: 10/03/2020 Document Reviewed: 10/03/2020 Elsevier Patient Education  2023 [employer]  

## 2022-10-09 ENCOUNTER — Encounter: Payer: Self-pay | Admitting: Family Medicine

## 2022-10-09 MED ORDER — ESTRADIOL 0.05 MG/24HR TD PTWK: 0.0500 mg | MEDICATED_PATCH | TRANSDERMAL | 1 refills | Status: DC

## 2022-11-19 ENCOUNTER — Other Ambulatory Visit: Payer: Self-pay | Admitting: Family Medicine

## 2022-11-19 DIAGNOSIS — E785 Hyperlipidemia, unspecified: Secondary | ICD-10-CM

## 2022-12-15 ENCOUNTER — Ambulatory Visit: Payer: BC Managed Care – PPO | Admitting: Family Medicine

## 2022-12-15 ENCOUNTER — Encounter: Payer: Self-pay | Admitting: Family Medicine

## 2022-12-15 VITALS — BP 120/78 | HR 69 | Temp 98.4°F | Resp 18 | Ht 68.0 in | Wt 167.6 lb

## 2022-12-15 DIAGNOSIS — N9089 Other specified noninflammatory disorders of vulva and perineum: Secondary | ICD-10-CM | POA: Diagnosis not present

## 2022-12-15 DIAGNOSIS — Z23 Encounter for immunization: Secondary | ICD-10-CM

## 2022-12-15 NOTE — Progress Notes (Signed)
Established Patient Office Visit  Subjective   Patient ID: Tanya Nelson, female    DOB: Mar 15, 1961  Age: 62 y.o. MRN: [id number]  Chief Complaint  Patient presents with   Sore    Pt refused to tell RMA her concern and wanted to speak with provider.     HPI Discussed the use of AI scribe software for clinical note transcription with the patient, who gave verbal consent to proceed.  History of Present Illness   The patient presents with a long-standing vulvar skin tag that has recently increased in size and become symptomatic. She reports that the skin tag swells with blood periodically, causing discomfort. The patient has a history of severe endometriosis and has undergone a hysterectomy. She denies any cyclical pattern to the swelling, but note that it has occurred twice in the past two weeks, seemingly after straining during bowel movements. The patient has not had a gynecologist since moving from [place] and is seeking a referral for further evaluation and potential removal of the skin tag.       Patient Active Problem List   Diagnosis Date Noted   Chronic pain of both knees 08/22/2022   Chest pain 07/30/2020   Preventative health care 07/01/2019   Acute pain of left shoulder 03/23/2017   THYROID NODULE 05/24/2010   Basal cell carcinoma, face 04/24/2009   Hyperlipidemia 04/24/2009   Asymptomatic postmenopausal status 04/24/2009   Malignant neoplasm of skin of face 04/24/2009   ACUTE LYMPHADENITIS 05/17/2008   OTHER ACNE 05/17/2008   MOLE 03/31/2008   PALPITATIONS, OCCASIONAL 03/31/2008   CARDIAC MURMUR, HX OF 02/23/2007   WISDOM TEETH EXTRACTION, HX OF 02/23/2007   Past Medical History:  Diagnosis Date   Acute lymphadenitis    Cancer (HCC)    Basal Cell--Face   Heart murmur    Hyperlipidemia    Past Surgical History:  Procedure Laterality Date   ABDOMINAL HYSTERECTOMY     MOHS SURGERY  08/2008   B[facility]   Social History   Tobacco Use   Smoking status: Never    Smokeless tobacco: Never  Vaping Use   Vaping status: Never Used  Substance Use Topics   Alcohol use: Yes    Comment: rare   Drug use: No   Social History   Socioeconomic History   Marital status: Married    Spouse name: Not on file   Number of children: Not on file   Years of education: Not on file   Highest education level: Bachelor's degree (e.g., BA, AB, BS)  Occupational History   Occupation: [employer]    Employer: [employer]  Tobacco Use   Smoking status: Never   Smokeless tobacco: Never  Vaping Use   Vaping status: Never Used  Substance and Sexual Activity   Alcohol use: Yes    Comment: rare   Drug use: No   Sexual activity: Yes    Partners: Male  Other Topics Concern   Not on file  Social History Narrative   Exercise-no   Social Determinants of Health   Financial Resource Strain: Low Risk  (12/11/2022)   Overall Financial Resource Strain (CARDIA)    Difficulty of Paying Living Expenses: Not hard at all  Food Insecurity: No Food Insecurity (12/11/2022)   Hunger Vital Sign    Worried About Running Out of Food in the Last Year: Never true    Ran Out of Food in the Last Year: Never true  Transportation Needs: No Transportation Needs (12/11/2022)  PRAPARE - Administrator, Civil Service (Medical): No    Lack of Transportation (Non-Medical): No  Physical Activity: Unknown (12/11/2022)   Exercise Vital Sign    Days of Exercise per Week: 0 days    Minutes of Exercise per Session: Not on file  Stress: No Stress Concern Present (12/11/2022)   [employer] of Occupational Health - Occupational Stress Questionnaire    Feeling of Stress : Only a little  Social Connections: Unknown (12/11/2022)   Social Connection and Isolation Panel [NHANES]    Frequency of Communication with Friends and Family: Patient declined    Frequency of Social Gatherings with Friends and Family: Patient declined    Attends Religious Services: Patient declined    Loss adjuster, chartered or Organizations: No    Attends Engineer, structural: Not on file    Marital Status: Married  Catering manager Violence: Not on file   Family Status  Relation Name Status   Mother  Deceased at age 5       blot clot, aneurysm   Father 18 Deceased   Sister  Armed forces training and education officer   Sister  Alive   Sister  Alive   Sister  Alive   Other  (Not Specified)   Neg Hx  (Not Specified)  No partnership data on file   Family History  Problem Relation Age of Onset   Hypertension Mother        Brain aneurysm   Aneurysm Mother    Prostate cancer Father    Hypertension Other    Breast cancer Neg Hx    Colon cancer Neg Hx    Colon polyps Neg Hx    Esophageal cancer Neg Hx    Rectal cancer Neg Hx    Stomach cancer Neg Hx    No Known Allergies    Review of Systems  Constitutional:  Negative for chills, fever and malaise/fatigue.  HENT:  Negative for congestion and hearing loss.   Eyes:  Negative for discharge.  Respiratory:  Negative for cough, sputum production and shortness of breath.   Cardiovascular:  Negative for chest pain, palpitations and leg swelling.  Gastrointestinal:  Negative for abdominal pain, blood in stool, constipation, diarrhea, heartburn, nausea and vomiting.  Genitourinary:  Negative for dysuria, frequency, hematuria and urgency.  Musculoskeletal:  Negative for back pain, falls and myalgias.  Skin:  Negative for rash.  Neurological:  Negative for dizziness, sensory change, loss of consciousness, weakness and headaches.  Endo/Heme/Allergies:  Negative for environmental allergies. Does not bruise/bleed easily.  Psychiatric/Behavioral:  Negative for depression and suicidal ideas. The patient is not nervous/anxious and does not have insomnia.       Objective:     BP 120/78 (BP Location: Left Arm, Patient Position: Sitting, Cuff Size: Normal)   Pulse 69   Temp 98.4 F (36.9 C) (Oral)   Resp 18   Ht 5\' 8"  (1.727 m)   Wt 167 lb 9.6 oz (76 kg)   SpO2 98%   BMI  25.48 kg/m  BP Readings from Last 3 Encounters:  12/15/22 120/78  08/22/22 100/70  02/19/22 118/64   Wt Readings from Last 3 Encounters:  12/15/22 167 lb 9.6 oz (76 kg)  08/22/22 167 lb (75.8 kg)  02/19/22 169 lb 1.9 oz (76.7 kg)   SpO2 Readings from Last 3 Encounters:  12/15/22 98%  08/22/22 98%  02/19/22 95%      Physical Exam Vitals and nursing note reviewed.  Constitutional:  General: She is not in acute distress.    Appearance: Normal appearance. She is well-developed.  HENT:     Head: Normocephalic and atraumatic.  Eyes:     General: No scleral icterus.       Right eye: No discharge.        Left eye: No discharge.  Cardiovascular:     Rate and Rhythm: Normal rate and regular rhythm.     Heart sounds: No murmur heard. Pulmonary:     Effort: Pulmonary effort is normal. No respiratory distress.     Breath sounds: Normal breath sounds.  Genitourinary:      Comments: Vulvar growth resembling a tag Periodically is engorged with blood and tender  Musculoskeletal:        General: Normal range of motion.     Cervical back: Normal range of motion and neck supple.     Right lower leg: No edema.     Left lower leg: No edema.  Skin:    General: Skin is warm and dry.  Neurological:     Mental Status: She is alert and oriented to person, place, and time.  Psychiatric:        Mood and Affect: Mood normal.        Behavior: Behavior normal.        Thought Content: Thought content normal.        Judgment: Judgment normal.     No results found for any visits on 12/15/22.  Last CBC Lab Results  Component Value Date   WBC 4.4 08/22/2022   HGB 14.3 08/22/2022   HCT 41.8 08/22/2022   MCV 91.4 08/22/2022   RDW 13.0 08/22/2022   PLT 207.0 08/22/2022   Last metabolic panel Lab Results  Component Value Date   GLUCOSE 87 08/22/2022   NA 138 08/22/2022   K 4.4 08/22/2022   CL 101 08/22/2022   CO2 29 08/22/2022   BUN 16 08/22/2022   CREATININE 1.04 08/22/2022    GFR 57.04 (L) 08/22/2022   CALCIUM 9.5 08/22/2022   PROT 7.3 08/22/2022   ALBUMIN 4.2 08/22/2022   BILITOT 0.6 08/22/2022   ALKPHOS 51 08/22/2022   AST 22 08/22/2022   ALT 20 08/22/2022   Last lipids Lab Results  Component Value Date   CHOL 137 08/22/2022   HDL 46.70 08/22/2022   LDLCALC 70 08/22/2022   LDLDIRECT 158.7 03/31/2008   TRIG 102.0 08/22/2022   CHOLHDL 3 08/22/2022   Last hemoglobin A1c No results found for: "HGBA1C" Last thyroid functions Lab Results  Component Value Date   TSH 0.75 08/22/2022   Last vitamin D Lab Results  Component Value Date   VD25OH 48 03/10/2011   Last vitamin B12 and Folate Lab Results  Component Value Date   VITAMINB12 636 02/14/2022      The 10-year ASCVD risk score (Arnett DK, et al., 2019) is: 3.8%    Assessment & Plan:   Problem List Items Addressed This Visit   None Visit Diagnoses     Vulvar skin tag    -  Primary   Relevant Orders   Ambulatory referral to Obstetrics / Gynecology   Need for influenza vaccination       Relevant Orders   Flu vaccine trivalent PF, 6mos and older(Flulaval,Afluria,Fluarix,Fluzone) (Completed)     Assessment and Plan    Vulvar Skin Tag Longstanding skin tag on vulva that has recently increased in size and become painful. No current signs of infection. History of severe endometriosis. -  Referral to gynecology for further evaluation and potential removal.  Influenza Vaccination Patient inquired about flu shot availability. -Check availability of flu shot and administer if available.        No follow-ups on file.    Donato Schultz, DO

## 2023-01-21 ENCOUNTER — Other Ambulatory Visit: Payer: Self-pay | Admitting: Family Medicine

## 2023-01-21 DIAGNOSIS — Z1231 Encounter for screening mammogram for malignant neoplasm of breast: Secondary | ICD-10-CM

## 2023-01-31 ENCOUNTER — Ambulatory Visit
Admission: RE | Admit: 2023-01-31 | Discharge: 2023-01-31 | Disposition: A | Discharge status: Home / Self Care | Payer: BC Managed Care – PPO | Source: Ambulatory Visit

## 2023-01-31 DIAGNOSIS — Z1231 Encounter for screening mammogram for malignant neoplasm of breast: Secondary | ICD-10-CM

## 2023-02-04 ENCOUNTER — Encounter: Payer: Self-pay | Admitting: Family Medicine

## 2023-02-19 ENCOUNTER — Encounter: Payer: Self-pay | Admitting: Family Medicine

## 2023-02-20 ENCOUNTER — Other Ambulatory Visit: Payer: Self-pay | Admitting: Family Medicine

## 2023-02-20 DIAGNOSIS — Z780 Asymptomatic menopausal state: Secondary | ICD-10-CM

## 2023-02-20 MED ORDER — ESTRADIOL 0.0375 MG/24HR TD PTWK
0.0375 mg | MEDICATED_PATCH | TRANSDERMAL | 12 refills | Status: DC
Start: 2023-02-20 — End: 2023-03-23

## 2023-02-20 NOTE — Telephone Encounter (Signed)
Please advise about estradiol.

## 2023-03-09 ENCOUNTER — Ambulatory Visit: Payer: BC Managed Care – PPO | Admitting: Family Medicine

## 2023-03-16 ENCOUNTER — Encounter: Payer: BC Managed Care – PPO | Admitting: Obstetrics and Gynecology

## 2023-03-23 ENCOUNTER — Encounter: Payer: Self-pay | Admitting: Family Medicine

## 2023-03-23 DIAGNOSIS — Z780 Asymptomatic menopausal state: Secondary | ICD-10-CM

## 2023-03-23 MED ORDER — ESTRADIOL 0.0375 MG/24HR TD PTWK: 0.0375 mg | MEDICATED_PATCH | TRANSDERMAL | 2 refills | Status: DC

## 2023-05-24 ENCOUNTER — Other Ambulatory Visit: Payer: Self-pay | Admitting: Family Medicine

## 2023-05-24 DIAGNOSIS — E785 Hyperlipidemia, unspecified: Secondary | ICD-10-CM

## 2023-07-01 DIAGNOSIS — D225 Melanocytic nevi of trunk: Secondary | ICD-10-CM | POA: Diagnosis not present

## 2023-07-01 DIAGNOSIS — L818 Other specified disorders of pigmentation: Secondary | ICD-10-CM | POA: Diagnosis not present

## 2023-07-01 DIAGNOSIS — L821 Other seborrheic keratosis: Secondary | ICD-10-CM | POA: Diagnosis not present

## 2023-07-01 DIAGNOSIS — C44319 Basal cell carcinoma of skin of other parts of face: Secondary | ICD-10-CM | POA: Diagnosis not present

## 2023-07-01 DIAGNOSIS — L814 Other melanin hyperpigmentation: Secondary | ICD-10-CM | POA: Diagnosis not present

## 2023-07-14 DIAGNOSIS — C44319 Basal cell carcinoma of skin of other parts of face: Secondary | ICD-10-CM | POA: Diagnosis not present

## 2023-08-06 DIAGNOSIS — C44319 Basal cell carcinoma of skin of other parts of face: Secondary | ICD-10-CM | POA: Diagnosis not present

## 2023-08-24 ENCOUNTER — Ambulatory Visit (INDEPENDENT_AMBULATORY_CARE_PROVIDER_SITE_OTHER): Payer: BC Managed Care – PPO | Admitting: Family Medicine

## 2023-08-24 ENCOUNTER — Encounter: Payer: Self-pay | Admitting: Family Medicine

## 2023-08-24 VITALS — BP 100/60 | HR 60 | Temp 98.1°F | Resp 18 | Ht 68.0 in | Wt 160.6 lb

## 2023-08-24 DIAGNOSIS — Z780 Asymptomatic menopausal state: Secondary | ICD-10-CM

## 2023-08-24 DIAGNOSIS — M545 Low back pain, unspecified: Secondary | ICD-10-CM

## 2023-08-24 DIAGNOSIS — E785 Hyperlipidemia, unspecified: Secondary | ICD-10-CM

## 2023-08-24 DIAGNOSIS — C44310 Basal cell carcinoma of skin of unspecified parts of face: Secondary | ICD-10-CM

## 2023-08-24 DIAGNOSIS — M25562 Pain in left knee: Secondary | ICD-10-CM

## 2023-08-24 DIAGNOSIS — G8929 Other chronic pain: Secondary | ICD-10-CM

## 2023-08-24 DIAGNOSIS — Z0001 Encounter for general adult medical examination with abnormal findings: Secondary | ICD-10-CM | POA: Diagnosis not present

## 2023-08-24 DIAGNOSIS — Z0000 Encounter for general adult medical examination without abnormal findings: Secondary | ICD-10-CM

## 2023-08-24 LAB — CBC WITH DIFFERENTIAL/PLATELET
Basophils Absolute: 0 10*3/uL (ref 0.0–0.1)
Basophils Relative: 0.2 % (ref 0.0–3.0)
Eosinophils Absolute: 0.1 10*3/uL (ref 0.0–0.7)
Eosinophils Relative: 2.8 % (ref 0.0–5.0)
HCT: 43.5 % (ref 36.0–46.0)
Hemoglobin: 14.5 g/dL (ref 12.0–15.0)
Lymphocytes Relative: 33 % (ref 12.0–46.0)
Lymphs Abs: 1.3 10*3/uL (ref 0.7–4.0)
MCHC: 33.3 g/dL (ref 30.0–36.0)
MCV: 92.8 fl (ref 78.0–100.0)
Monocytes Absolute: 0.5 10*3/uL (ref 0.1–1.0)
Monocytes Relative: 12.6 % — ABNORMAL HIGH (ref 3.0–12.0)
Neutro Abs: 2.1 10*3/uL (ref 1.4–7.7)
Neutrophils Relative %: 51.4 % (ref 43.0–77.0)
Platelets: 196 10*3/uL (ref 150.0–400.0)
RBC: 4.68 Mil/uL (ref 3.87–5.11)
RDW: 12.7 % (ref 11.5–15.5)
WBC: 4 10*3/uL (ref 4.0–10.5)

## 2023-08-24 LAB — COMPREHENSIVE METABOLIC PANEL WITH GFR
ALT: 19 U/L (ref 0–35)
AST: 19 U/L (ref 0–37)
Albumin: 4.3 g/dL (ref 3.5–5.2)
Alkaline Phosphatase: 52 U/L (ref 39–117)
BUN: 22 mg/dL (ref 6–23)
CO2: 29 meq/L (ref 19–32)
Calcium: 9.4 mg/dL (ref 8.4–10.5)
Chloride: 102 meq/L (ref 96–112)
Creatinine, Ser: 1.12 mg/dL (ref 0.40–1.20)
GFR: 51.82 mL/min — ABNORMAL LOW (ref >60.00)
Glucose, Bld: 92 mg/dL (ref 70–99)
Potassium: 4.3 meq/L (ref 3.5–5.1)
Sodium: 139 meq/L (ref 135–145)
Total Bilirubin: 0.5 mg/dL (ref 0.2–1.2)
Total Protein: 7.1 g/dL (ref 6.0–8.3)

## 2023-08-24 LAB — LIPID PANEL
Cholesterol: 131 mg/dL (ref 0–200)
HDL: 41.5 mg/dL (ref >39.00)
LDL Cholesterol: 73 mg/dL (ref 0–99)
NonHDL: 89.54
Total CHOL/HDL Ratio: 3
Triglycerides: 81 mg/dL (ref 0.0–149.0)
VLDL: 16.2 mg/dL (ref 0.0–40.0)

## 2023-08-24 LAB — TSH: TSH: 0.85 u[IU]/mL (ref 0.35–5.50)

## 2023-08-24 MED ORDER — ESTRADIOL 0.025 MG/24HR TD PTWK: 0.0250 mg | MEDICATED_PATCH | TRANSDERMAL | 12 refills | Status: DC

## 2023-08-24 NOTE — Patient Instructions (Signed)
 Preventive Care 16-65 Years Old, Female  Preventive care refers to lifestyle choices and visits with your health care provider that can promote health and wellness. Preventive care visits are also called wellness exams.  What can I expect for my preventive care visit?  Counseling  Your health care provider may ask you questions about your:  Medical history, including:  Past medical problems.  Family medical history.  Pregnancy history.  Current health, including:  Menstrual cycle.  Method of birth control.  Emotional well-being.  Home life and relationship well-being.  Sexual activity and sexual health.  Lifestyle, including:  Alcohol, nicotine or tobacco, and drug use.  Access to firearms.  Diet, exercise, and sleep habits.  Work and work Astronomer.  Sunscreen use.  Safety issues such as seatbelt and bike helmet use.  Physical exam  Your health care provider will check your:  Height and weight. These may be used to calculate your BMI (body mass index). BMI is a measurement that tells if you are at a healthy weight.  Waist circumference. This measures the distance around your waistline. This measurement also tells if you are at a healthy weight and may help predict your risk of certain diseases, such as type 2 diabetes and high blood pressure.  Heart rate and blood pressure.  Body temperature.  Skin for abnormal spots.  What immunizations do I need?    Vaccines are usually given at various ages, according to a schedule. Your health care provider will recommend vaccines for you based on your age, medical history, and lifestyle or other factors, such as travel or where you work.  What tests do I need?  Screening  Your health care provider may recommend screening tests for certain conditions. This may include:  Lipid and cholesterol levels.  Diabetes screening. This is done by checking your blood sugar (glucose) after you have not eaten for a while (fasting).  Pelvic exam and Pap test.  Hepatitis B test.  Hepatitis C  test.  HIV (human immunodeficiency virus) test.  STI (sexually transmitted infection) testing, if you are at risk.  Lung cancer screening.  Colorectal cancer screening.  Mammogram. Talk with your health care provider about when you should start having regular mammograms. This may depend on whether you have a family history of breast cancer.  BRCA-related cancer screening. This may be done if you have a family history of breast, ovarian, tubal, or peritoneal cancers.  Bone density scan. This is done to screen for osteoporosis.  Talk with your health care provider about your test results, treatment options, and if necessary, the need for more tests.  Follow these instructions at home:  Eating and drinking    Eat a diet that includes fresh fruits and vegetables, whole grains, lean protein, and low-fat dairy products.  Take vitamin and mineral supplements as recommended by your health care provider.  Do not drink alcohol if:  Your health care provider tells you not to drink.  You are pregnant, may be pregnant, or are planning to become pregnant.  If you drink alcohol:  Limit how much you have to 0-1 drink a day.  Know how much alcohol is in your drink. In the [place] one drink equals one 12 oz bottle of beer (355 mL), one 5 oz glass of wine (148 mL), or one 1 oz glass of hard liquor (44 mL).  Lifestyle  Brush your teeth every morning and night with fluoride toothpaste. Floss one time each day.  Exercise for at least  30 minutes 5 or more days each week.  Do not use any products that contain nicotine or tobacco. These products include cigarettes, chewing tobacco, and vaping devices, such as e-cigarettes. If you need help quitting, ask your health care provider.  Do not use drugs.  If you are sexually active, practice safe sex. Use a condom or other form of protection to prevent STIs.  If you do not wish to become pregnant, use a form of birth control. If you plan to become pregnant, see your health care provider for a  prepregnancy visit.  Take aspirin only as told by your health care provider. Make sure that you understand how much to take and what form to take. Work with your health care provider to find out whether it is safe and beneficial for you to take aspirin daily.  Find healthy ways to manage stress, such as:  Meditation, yoga, or listening to music.  Journaling.  Talking to a trusted person.  Spending time with friends and family.  Minimize exposure to UV radiation to reduce your risk of skin cancer.  Safety  Always wear your seat belt while driving or riding in a vehicle.  Do not drive:  If you have been drinking alcohol. Do not ride with someone who has been drinking.  When you are tired or distracted.  While texting.  If you have been using any mind-altering substances or drugs.  Wear a helmet and other protective equipment during sports activities.  If you have firearms in your house, make sure you follow all gun safety procedures.  Seek help if you have been physically or sexually abused.  What's next?  Visit your health care provider once a year for an annual wellness visit.  Ask your health care provider how often you should have your eyes and teeth checked.  Stay up to date on all vaccines.  This information is not intended to replace advice given to you by your health care provider. Make sure you discuss any questions you have with your health care provider.  Document Revised: 10/03/2020 Document Reviewed: 10/03/2020  Elsevier Patient Education  2024 [employer].

## 2023-08-24 NOTE — Progress Notes (Signed)
 Established Patient Office Visit  Subjective   Patient ID: Tanya Nelson, female    DOB: [birth year]  Age: 63 y.o. MRN: [id number]  Chief Complaint  Patient presents with   Annual Exam    Pt states fasting     HPI Discussed the use of AI scribe software for clinical note transcription with the patient, who gave verbal consent to proceed.  History of Present Illness Tanya Nelson is a 63 year old female who presents with lower back pain and left knee instability.  She experiences intermittent lower back pain, particularly in the mornings. The pain is severe when she starts walking but improves by the time she arrives at work, especially after using heat on her back during her commute. She sometimes takes Aleve for relief, which helps alleviate the pain. No radiation of back pain down the leg or groin pain is reported.  She has issues with her left knee, which has started to either lock up or give out, causing concern about potential falls. The knee does not hurt all the time, but it can become sore, especially when she was on a protein diet with her husband. The knee instability occurs sporadically, sometimes causing her knee to 'skid out' while walking, though not enough to cause a fall. She mentions that her knee feels 'very crunchy' but does not experience pain when pressure is applied.  Her social history includes plans to retire in July, and she currently works in a job where she is mostly sitting. She has a high arch and acknowledges that her current work shoes lack support, which she suspects might contribute to her knee and back issues.   Patient Active Problem List   Diagnosis Date Noted   Chronic pain of both knees 08/22/2022   Chest pain 07/30/2020   Preventative health care 07/01/2019   Acute pain of left shoulder 03/23/2017   THYROID  NODULE 05/24/2010   Basal cell carcinoma, face 04/24/2009   Hyperlipidemia 04/24/2009   Asymptomatic postmenopausal status 04/24/2009    Malignant neoplasm of skin of face 04/24/2009   ACUTE LYMPHADENITIS 05/17/2008   OTHER ACNE 05/17/2008   Benign neoplasm of skin 03/31/2008   PALPITATIONS, OCCASIONAL 03/31/2008   History of cardiovascular disorder 02/23/2007   Personal history presenting hazards to health 02/23/2007   Past Medical History:  Diagnosis Date   Acute lymphadenitis    Cancer (HCC)    Basal Cell--Face   Heart murmur    Hyperlipidemia    Past Surgical History:  Procedure Laterality Date   ABDOMINAL HYSTERECTOMY     MOHS SURGERY  08/19/2008   [facility]--- 2025 again [place]   Social History   Tobacco Use   Smoking status: Never   Smokeless tobacco: Never  Vaping Use   Vaping status: Never Used  Substance Use Topics   Alcohol use: Yes    Comment: rare   Drug use: No   Social History   Socioeconomic History   Marital status: Married    Spouse name: Not on file   Number of children: Not on file   Years of education: Not on file   Highest education level: Bachelor's degree (e.g., BA, AB, BS)  Occupational History   Occupation: [employer]    Employer: [employer]  Tobacco Use   Smoking status: Never   Smokeless tobacco: Never  Vaping Use   Vaping status: Never Used  Substance and Sexual Activity   Alcohol use: Yes    Comment: rare   Drug use:  No   Sexual activity: Yes    Partners: Male  Other Topics Concern   Not on file  Social History Narrative   Exercise-no   Social Drivers of Health   Financial Resource Strain: Low Risk  (12/11/2022)   Overall Financial Resource Strain (CARDIA)    Difficulty of Paying Living Expenses: Not hard at all  Food Insecurity: No Food Insecurity (12/11/2022)   Hunger Vital Sign    Worried About Running Out of Food in the Last Year: Never true    Ran Out of Food in the Last Year: Never true  Transportation Needs: No Transportation Needs (12/11/2022)   PRAPARE - Administrator, Civil Service (Medical): No    Lack of Transportation  (Non-Medical): No  Physical Activity: Unknown (12/11/2022)   Exercise Vital Sign    Days of Exercise per Week: 0 days    Minutes of Exercise per Session: Not on file  Stress: No Stress Concern Present (12/11/2022)   [employer] of Occupational Health - Occupational Stress Questionnaire    Feeling of Stress : Only a little  Social Connections: Unknown (12/11/2022)   Social Connection and Isolation Panel [NHANES]    Frequency of Communication with Friends and Family: Patient declined    Frequency of Social Gatherings with Friends and Family: Patient declined    Attends Religious Services: Patient declined    Database administrator or Organizations: No    Attends Engineer, structural: Not on file    Marital Status: Married  Catering manager Violence: Not on file   Family Status  Relation Name Status   Mother  Deceased at age 51       blot clot, aneurysm   Father 54 Deceased   Sister  Armed forces training and education officer   Sister  Alive   Sister  Alive   Sister  Alive   Other  (Not Specified)   Neg Hx  (Not Specified)  No partnership data on file   Family History  Problem Relation Age of Onset   Hypertension Mother        Brain aneurysm   Aneurysm Mother    Prostate cancer Father    Hypertension Other    Breast cancer Neg Hx    Colon cancer Neg Hx    Colon polyps Neg Hx    Esophageal cancer Neg Hx    Rectal cancer Neg Hx    Stomach cancer Neg Hx    No Known Allergies    Review of Systems  Constitutional:  Negative for chills, fever and malaise/fatigue.  HENT:  Negative for congestion and hearing loss.   Eyes:  Negative for discharge.  Respiratory:  Negative for cough, sputum production and shortness of breath.   Cardiovascular:  Negative for chest pain, palpitations and leg swelling.  Gastrointestinal:  Negative for abdominal pain, blood in stool, constipation, diarrhea, heartburn, nausea and vomiting.  Genitourinary:  Negative for dysuria, frequency, hematuria and urgency.   Musculoskeletal:  Negative for back pain, falls and myalgias.  Skin:  Negative for rash.  Neurological:  Negative for dizziness, sensory change, loss of consciousness, weakness and headaches.  Endo/Heme/Allergies:  Negative for environmental allergies. Does not bruise/bleed easily.  Psychiatric/Behavioral:  Negative for depression and suicidal ideas. The patient is not nervous/anxious and does not have insomnia.       Objective:     BP 100/60 (BP Location: Left Arm, Patient Position: Sitting, Cuff Size: Normal)   Pulse 60   Temp 98.1 F (36.7 C) (  Oral)   Resp 18   Ht 5\' 8"  (1.727 m)   Wt 160 lb 9.6 oz (72.8 kg)   SpO2 98%   BMI 24.42 kg/m  BP Readings from Last 3 Encounters:  08/24/23 100/60  12/15/22 120/78  08/22/22 100/70   Wt Readings from Last 3 Encounters:  08/24/23 160 lb 9.6 oz (72.8 kg)  12/15/22 167 lb 9.6 oz (76 kg)  08/22/22 167 lb (75.8 kg)   SpO2 Readings from Last 3 Encounters:  08/24/23 98%  12/15/22 98%  08/22/22 98%      Physical Exam Vitals and nursing note reviewed.  Constitutional:      General: She is not in acute distress.    Appearance: Normal appearance. She is well-developed.  HENT:     Head: Normocephalic and atraumatic.     Right Ear: Tympanic membrane, ear canal and external ear normal. There is no impacted cerumen.     Left Ear: Tympanic membrane, ear canal and external ear normal. There is no impacted cerumen.     Nose: Nose normal.     Mouth/Throat:     Mouth: Mucous membranes are moist.     Pharynx: Oropharynx is clear. No oropharyngeal exudate or posterior oropharyngeal erythema.  Eyes:     General: No scleral icterus.       Right eye: No discharge.        Left eye: No discharge.     Conjunctiva/sclera: Conjunctivae normal.     Pupils: Pupils are equal, round, and reactive to light.  Neck:     Thyroid : No thyromegaly or thyroid  tenderness.     Vascular: No JVD.  Cardiovascular:     Rate and Rhythm: Normal rate and  regular rhythm.     Heart sounds: Normal heart sounds. No murmur heard. Pulmonary:     Effort: Pulmonary effort is normal. No respiratory distress.     Breath sounds: Normal breath sounds.  Abdominal:     General: Bowel sounds are normal. There is no distension.     Palpations: Abdomen is soft. There is no mass.     Tenderness: There is no abdominal tenderness. There is no guarding or rebound.  Genitourinary:    Vagina: Normal.  Musculoskeletal:        General: Normal range of motion.     Cervical back: Normal range of motion and neck supple.     Right lower leg: No edema.     Left lower leg: No edema.  Lymphadenopathy:     Cervical: No cervical adenopathy.  Skin:    General: Skin is warm and dry.     Findings: No erythema or rash.  Neurological:     Mental Status: She is alert and oriented to person, place, and time.     Cranial Nerves: No cranial nerve deficit.     Deep Tendon Reflexes: Reflexes are normal and symmetric.  Psychiatric:        Mood and Affect: Mood normal.        Behavior: Behavior normal.        Thought Content: Thought content normal.        Judgment: Judgment normal.      No results found for any visits on 08/24/23.  Last CBC Lab Results  Component Value Date   WBC 4.4 08/22/2022   HGB 14.3 08/22/2022   HCT 41.8 08/22/2022   MCV 91.4 08/22/2022   RDW 13.0 08/22/2022   PLT 207.0 08/22/2022   Last metabolic panel Lab  Results  Component Value Date   GLUCOSE 87 08/22/2022   NA 138 08/22/2022   K 4.4 08/22/2022   CL 101 08/22/2022   CO2 29 08/22/2022   BUN 16 08/22/2022   CREATININE 1.04 08/22/2022   GFR 57.04 (L) 08/22/2022   CALCIUM 9.5 08/22/2022   PROT 7.3 08/22/2022   ALBUMIN 4.2 08/22/2022   BILITOT 0.6 08/22/2022   ALKPHOS 51 08/22/2022   AST 22 08/22/2022   ALT 20 08/22/2022   Last lipids Lab Results  Component Value Date   CHOL 137 08/22/2022   HDL 46.70 08/22/2022   LDLCALC 70 08/22/2022   LDLDIRECT 158.7 03/31/2008    TRIG 102.0 08/22/2022   CHOLHDL 3 08/22/2022   Last hemoglobin A1c No results found for: "HGBA1C" Last thyroid  functions Lab Results  Component Value Date   TSH 0.75 08/22/2022   Last vitamin D  Lab Results  Component Value Date   VD25OH 48 03/10/2011   Last vitamin B12 and Folate Lab Results  Component Value Date   VITAMINB12 636 02/14/2022      The 10-year ASCVD risk score (Arnett DK, et al., 2019) is: 2.7%    Assessment & Plan:   Problem List Items Addressed This Visit       Unprioritized   Preventative health care - Primary   Ghm utd Check labs  See AVS Health Maintenance  Topic Date Due   COVID-19 Vaccine (7 - Pfizer risk 2024-25 season) 06/19/2023   INFLUENZA VACCINE  11/20/2023   MAMMOGRAM  01/31/2024   DTaP/Tdap/Td (4 - Td or Tdap) 06/30/2029   Colonoscopy  11/28/2030   Hepatitis C Screening  Completed   HIV Screening  Completed   Zoster Vaccines- Shingrix   Completed   HPV VACCINES  Aged Out   Meningococcal B Vaccine  Aged Out         Relevant Orders   CBC with Differential/Platelet   Comprehensive metabolic panel with GFR   Lipid panel   TSH   Hyperlipidemia   Tolerating statin, encouraged heart healthy diet, avoid trans fats, minimize simple carbs and saturated fats. Increase exercise as tolerated       Relevant Orders   CBC with Differential/Platelet   Comprehensive metabolic panel with GFR   Lipid panel   TSH   Basal cell carcinoma, face   F/u derm      Other Visit Diagnoses       Menopause       Relevant Medications   estradiol  (CLIMARA ) 0.025 mg/24hr patch     Chronic pain of left knee       Relevant Orders   Ambulatory referral to Sports Medicine   DG Knee Complete 4 Views Left     Acute left-sided low back pain without sciatica       Relevant Orders   Ambulatory referral to Sports Medicine   DG Lumbar Spine Complete     Assessment and Plan Assessment & Plan Low back pain   She experiences intermittent low back  pain, especially in the morning, which improves with heat and naproxen and resolves by the time she arrives at work. The pain does not radiate down the leg but sometimes extends into the groin, suggesting possible hip involvement. The potential impact of footwear on symptoms was discussed. Recommend daily naproxen for pain management, heat therapy for relief, core strengthening exercises, and shoes with good arch support or orthotics.  Knee instability   She reports intermittent left knee instability with occasional locking or giving  out sensation, though no significant pain is noted. Symptoms have improved with dietary changes, and there may be a relation to footwear with inadequate support, raising concern for potential fall risk. Order knee x-rays to evaluate structural issues and refer to sports medicine for further evaluation. Recommend ice therapy for 15-20 minutes post-activity, acetaminophen or naproxen for pain management, core strengthening exercises, and shoes with good arch support or orthotics.  Basal cell carcinoma   A recent basal cell carcinoma excision was performed by dermatology, requiring only one session of Mohs surgery due to the thinness of the lesion. Continue regular dermatology follow-ups for skin checks.  Wellness Visit   She is up to date with all routine health maintenance, with no new concerns or changes in family history. She plans to retire in July and maintains regular follow-ups with dermatology, cardiology, ophthalmology, and dentistry. Continue the current health maintenance schedule and follow up with specialists as needed.    Return in about 1 year (around 08/23/2024), or if symptoms worsen or fail to improve, for annual exam, fasting.    Mouhamed Glassco R Lowne Chase, DO

## 2023-08-24 NOTE — Assessment & Plan Note (Signed)
 Ghm utd Check labs  See AVS Health Maintenance  Topic Date Due   COVID-19 Vaccine (7 - Pfizer risk 2024-25 season) 06/19/2023   INFLUENZA VACCINE  11/20/2023   MAMMOGRAM  01/31/2024   DTaP/Tdap/Td (4 - Td or Tdap) 06/30/2029   Colonoscopy  11/28/2030   Hepatitis C Screening  Completed   HIV Screening  Completed   Zoster Vaccines- Shingrix   Completed   HPV VACCINES  Aged Out   Meningococcal B Vaccine  Aged Out

## 2023-08-24 NOTE — Assessment & Plan Note (Signed)
   F/u derm

## 2023-08-24 NOTE — Assessment & Plan Note (Signed)
 Tolerating statin, encouraged heart healthy diet, avoid trans fats, minimize simple carbs and saturated fats. Increase exercise as tolerated

## 2023-08-26 ENCOUNTER — Encounter: Payer: Self-pay | Admitting: Family Medicine

## 2023-09-03 ENCOUNTER — Ambulatory Visit (HOSPITAL_BASED_OUTPATIENT_CLINIC_OR_DEPARTMENT_OTHER)
Admission: RE | Admit: 2023-09-03 | Discharge: 2023-09-03 | Disposition: A | Discharge status: Home / Self Care | Source: Ambulatory Visit | Attending: Sports Medicine | Admitting: Sports Medicine

## 2023-09-03 ENCOUNTER — Encounter: Payer: Self-pay | Admitting: Sports Medicine

## 2023-09-03 ENCOUNTER — Ambulatory Visit (HOSPITAL_BASED_OUTPATIENT_CLINIC_OR_DEPARTMENT_OTHER)
Admission: RE | Admit: 2023-09-03 | Discharge: 2023-09-03 | Disposition: A | Discharge status: Home / Self Care | Source: Ambulatory Visit | Attending: Family Medicine | Admitting: Family Medicine

## 2023-09-03 ENCOUNTER — Ambulatory Visit: Payer: Self-pay | Admitting: Family Medicine

## 2023-09-03 ENCOUNTER — Ambulatory Visit: Admitting: Sports Medicine

## 2023-09-03 VITALS — BP 140/80 | Ht 68.0 in | Wt 160.0 lb

## 2023-09-03 DIAGNOSIS — M545 Low back pain, unspecified: Secondary | ICD-10-CM

## 2023-09-03 DIAGNOSIS — M1712 Unilateral primary osteoarthritis, left knee: Secondary | ICD-10-CM | POA: Diagnosis not present

## 2023-09-03 DIAGNOSIS — M25562 Pain in left knee: Secondary | ICD-10-CM | POA: Diagnosis not present

## 2023-09-03 DIAGNOSIS — M25462 Effusion, left knee: Secondary | ICD-10-CM | POA: Diagnosis not present

## 2023-09-03 DIAGNOSIS — G8929 Other chronic pain: Secondary | ICD-10-CM | POA: Insufficient documentation

## 2023-09-03 DIAGNOSIS — M47816 Spondylosis without myelopathy or radiculopathy, lumbar region: Secondary | ICD-10-CM | POA: Diagnosis not present

## 2023-09-03 NOTE — Progress Notes (Signed)
   Subjective:    Patient ID: Tanya Nelson, female    DOB: [birth year], 63 y.o.   MRN: [id number]  HPI chief complaint: Left hip and left knee pain  Patient is a very pleasant 63 year old female that presents today with approximately 6 months of left-sided low back/posterior hip pain and left knee instability.  Her hip pain is worse first thing in the morning.  She takes an Aleve which does help.  Pain seems to improve throughout the day.  She localizes her pain near the area of the SI joint and lower back.  Does not seem to be aggravated by any specific activity or position.  In regards to the left knee, she describes a feeling of instability and catching in the knee that occurs daily.  No significant pain but she feels like the knee will give way.  No trauma.  No swelling.  Past medical history reviewed Medications reviewed Allergies reviewed  Review of Systems As above    Objective:   Physical Exam  Well-developed, well-nourished.  No acute distress  Left hip: Smooth painless hip range of motion with a negative logroll.  Slight tenderness to palpation around the SI joint and left lower spine but a negative Veldon German.  Lumbar spine: Full painless lumbar range of motion  Left knee: Full range of motion.  No effusion.  1+ patellofemoral crepitus.  No tenderness to palpation along medial or lateral joint lines.  Negative McMurray's.  Negative Thessaly's.  Knee is stable to ligamentous exam.  X-ray of the lumbar spine including AP and lateral views show some mild facet joint sclerosis at L4-L5 and L5-S1.  Well-preserved disc space.  She also has a small 7 mm calcification within the right pelvis which may represent a small kidney stone.  X-ray of the left knee including AP, lateral, and sunrise views do show some moderate tricompartmental degenerative changes.    Assessment & Plan:   Left-sided low back pain possibly secondary to facet arthropathy Left knee instability with x-ray evidence  of moderate DJD  Home exercise program for her knee.  Specifically, she will start isometric quad exercises, isometric hip abductors, and pelvic stabilizer exercises.  Follow-up with me again in 6 weeks.  I did discuss merits of MRI of the left knee to rule out occult intra-articular loose body if symptoms do not improve with her home exercises.  We could also consider formal physical therapy.  She is instructed to follow-up with her PCP if she is experiencing any symptoms of a kidney stone.  This note was dictated using Dragon naturally speaking software and may contain errors in syntax, spelling, or content which have not been identified prior to signing this note.

## 2023-09-17 ENCOUNTER — Other Ambulatory Visit: Payer: Self-pay | Admitting: Family Medicine

## 2023-09-17 DIAGNOSIS — R1084 Generalized abdominal pain: Secondary | ICD-10-CM

## 2023-09-19 ENCOUNTER — Ambulatory Visit (HOSPITAL_BASED_OUTPATIENT_CLINIC_OR_DEPARTMENT_OTHER)
Admission: RE | Admit: 2023-09-19 | Discharge: 2023-09-19 | Disposition: A | Discharge status: Home / Self Care | Source: Ambulatory Visit | Attending: Family Medicine | Admitting: Family Medicine

## 2023-09-19 DIAGNOSIS — R1084 Generalized abdominal pain: Secondary | ICD-10-CM | POA: Insufficient documentation

## 2023-09-20 ENCOUNTER — Ambulatory Visit: Payer: Self-pay | Admitting: Family Medicine

## 2023-10-22 ENCOUNTER — Encounter: Payer: Self-pay | Admitting: Sports Medicine

## 2023-10-22 ENCOUNTER — Ambulatory Visit: Admitting: Sports Medicine

## 2023-10-22 VITALS — BP 120/80 | Ht 68.0 in | Wt 160.0 lb

## 2023-10-22 DIAGNOSIS — G8929 Other chronic pain: Secondary | ICD-10-CM | POA: Diagnosis not present

## 2023-10-22 DIAGNOSIS — M545 Low back pain, unspecified: Secondary | ICD-10-CM | POA: Diagnosis not present

## 2023-10-22 DIAGNOSIS — M25562 Pain in left knee: Secondary | ICD-10-CM | POA: Diagnosis not present

## 2023-10-22 NOTE — Progress Notes (Signed)
   Subjective:    Patient ID: Tanya Nelson, female    DOB: 12-Mar-1961, 63 y.o.   MRN: [id number]  HPI Shaleta presents today for follow-up on low back and left knee pain.  Left knee is improving.  The episodes of instability and locking are becoming less frequent.  Her low back pain persists however.  In fact, some of the home exercises for her left knee arthritis has made her back pain worse.  Her pain is still worse first thing in the morning and improves throughout the day.  Aleve is still helpful.  X-rays of her lumbar spine do show some facet sclerosis at L4-L5 and L5-S1.  Left knee x-rays show moderate degenerative changes.   Review of Systems As above    Objective:   Physical Exam  Well-developed, well-nourished.  No acute distress  Lumbar spine: Reproducible pain with extension.  Pain improves with flexion.  No gross neurological deficit of either lower extremity.      Assessment & Plan:   Low back pain secondary to facet arthropathy Left knee DJD  For her low back pain, I discussed formal physical therapy versus MRI to plan for facet injections.  She would like to start with physical therapy.  We will refer her to [facility] for this with instructions to avoid extension exercises.  I will also have them address her left knee arthritis.  She may wean to a home exercise program per the therapist discretion.  She will continue with her Aleve as needed for pain.  If she does not notice improvement with 4 weeks of formal PT, she will MyChart message me and we will reconsider MRI at that time.  This note was dictated using Dragon naturally speaking software and may contain errors in syntax, spelling, or content which have not been identified prior to signing this note.

## 2023-10-26 DIAGNOSIS — M2569 Stiffness of other specified joint, not elsewhere classified: Secondary | ICD-10-CM | POA: Diagnosis not present

## 2023-10-26 DIAGNOSIS — M5451 Vertebrogenic low back pain: Secondary | ICD-10-CM | POA: Diagnosis not present

## 2023-10-26 DIAGNOSIS — M25562 Pain in left knee: Secondary | ICD-10-CM | POA: Diagnosis not present

## 2023-10-26 DIAGNOSIS — M6281 Muscle weakness (generalized): Secondary | ICD-10-CM | POA: Diagnosis not present

## 2023-10-30 DIAGNOSIS — M6281 Muscle weakness (generalized): Secondary | ICD-10-CM | POA: Diagnosis not present

## 2023-10-30 DIAGNOSIS — M5451 Vertebrogenic low back pain: Secondary | ICD-10-CM | POA: Diagnosis not present

## 2023-10-30 DIAGNOSIS — M25562 Pain in left knee: Secondary | ICD-10-CM | POA: Diagnosis not present

## 2023-10-30 DIAGNOSIS — M2569 Stiffness of other specified joint, not elsewhere classified: Secondary | ICD-10-CM | POA: Diagnosis not present

## 2023-11-04 DIAGNOSIS — M5451 Vertebrogenic low back pain: Secondary | ICD-10-CM | POA: Diagnosis not present

## 2023-11-04 DIAGNOSIS — M6281 Muscle weakness (generalized): Secondary | ICD-10-CM | POA: Diagnosis not present

## 2023-11-04 DIAGNOSIS — M25562 Pain in left knee: Secondary | ICD-10-CM | POA: Diagnosis not present

## 2023-11-04 DIAGNOSIS — M2569 Stiffness of other specified joint, not elsewhere classified: Secondary | ICD-10-CM | POA: Diagnosis not present

## 2023-11-06 DIAGNOSIS — M25562 Pain in left knee: Secondary | ICD-10-CM | POA: Diagnosis not present

## 2023-11-06 DIAGNOSIS — M2569 Stiffness of other specified joint, not elsewhere classified: Secondary | ICD-10-CM | POA: Diagnosis not present

## 2023-11-06 DIAGNOSIS — M5451 Vertebrogenic low back pain: Secondary | ICD-10-CM | POA: Diagnosis not present

## 2023-11-06 DIAGNOSIS — M6281 Muscle weakness (generalized): Secondary | ICD-10-CM | POA: Diagnosis not present

## 2023-11-11 DIAGNOSIS — M6281 Muscle weakness (generalized): Secondary | ICD-10-CM | POA: Diagnosis not present

## 2023-11-11 DIAGNOSIS — M5451 Vertebrogenic low back pain: Secondary | ICD-10-CM | POA: Diagnosis not present

## 2023-11-11 DIAGNOSIS — M25562 Pain in left knee: Secondary | ICD-10-CM | POA: Diagnosis not present

## 2023-11-11 DIAGNOSIS — M2569 Stiffness of other specified joint, not elsewhere classified: Secondary | ICD-10-CM | POA: Diagnosis not present

## 2023-11-13 DIAGNOSIS — M25562 Pain in left knee: Secondary | ICD-10-CM | POA: Diagnosis not present

## 2023-11-13 DIAGNOSIS — M5451 Vertebrogenic low back pain: Secondary | ICD-10-CM | POA: Diagnosis not present

## 2023-11-13 DIAGNOSIS — M2569 Stiffness of other specified joint, not elsewhere classified: Secondary | ICD-10-CM | POA: Diagnosis not present

## 2023-11-13 DIAGNOSIS — M6281 Muscle weakness (generalized): Secondary | ICD-10-CM | POA: Diagnosis not present

## 2023-11-15 ENCOUNTER — Other Ambulatory Visit: Payer: Self-pay | Admitting: Family Medicine

## 2023-11-15 DIAGNOSIS — E785 Hyperlipidemia, unspecified: Secondary | ICD-10-CM

## 2023-11-16 DIAGNOSIS — M5451 Vertebrogenic low back pain: Secondary | ICD-10-CM | POA: Diagnosis not present

## 2023-11-16 DIAGNOSIS — M6281 Muscle weakness (generalized): Secondary | ICD-10-CM | POA: Diagnosis not present

## 2023-11-16 DIAGNOSIS — M25562 Pain in left knee: Secondary | ICD-10-CM | POA: Diagnosis not present

## 2023-11-16 DIAGNOSIS — M2569 Stiffness of other specified joint, not elsewhere classified: Secondary | ICD-10-CM | POA: Diagnosis not present

## 2023-11-18 DIAGNOSIS — M5451 Vertebrogenic low back pain: Secondary | ICD-10-CM | POA: Diagnosis not present

## 2023-11-18 DIAGNOSIS — M2569 Stiffness of other specified joint, not elsewhere classified: Secondary | ICD-10-CM | POA: Diagnosis not present

## 2023-11-18 DIAGNOSIS — M6281 Muscle weakness (generalized): Secondary | ICD-10-CM | POA: Diagnosis not present

## 2023-11-18 DIAGNOSIS — M25562 Pain in left knee: Secondary | ICD-10-CM | POA: Diagnosis not present

## 2024-01-01 ENCOUNTER — Encounter: Payer: Self-pay | Admitting: Family Medicine

## 2024-01-01 MED ORDER — COVID-19 MRNA VAC-TRIS(PFIZER) 30 MCG/0.3ML IM SUSY
0.3000 mL | PREFILLED_SYRINGE | Freq: Once | INTRAMUSCULAR | 0 refills | Status: AC
Start: 2024-01-01 — End: 2024-01-01

## 2024-01-29 ENCOUNTER — Other Ambulatory Visit: Payer: Self-pay | Admitting: Family Medicine

## 2024-01-29 DIAGNOSIS — Z780 Asymptomatic menopausal state: Secondary | ICD-10-CM

## 2024-02-10 ENCOUNTER — Other Ambulatory Visit: Payer: Self-pay | Admitting: Family Medicine

## 2024-02-10 DIAGNOSIS — E785 Hyperlipidemia, unspecified: Secondary | ICD-10-CM

## 2024-03-14 ENCOUNTER — Other Ambulatory Visit: Payer: Self-pay | Admitting: Family Medicine

## 2024-03-14 DIAGNOSIS — Z1231 Encounter for screening mammogram for malignant neoplasm of breast: Secondary | ICD-10-CM

## 2024-04-08 ENCOUNTER — Inpatient Hospital Stay: Admission: RE | Admit: 2024-04-08 | Discharge: 2024-04-08

## 2024-04-08 DIAGNOSIS — Z1231 Encounter for screening mammogram for malignant neoplasm of breast: Secondary | ICD-10-CM

## 2024-04-27 ENCOUNTER — Other Ambulatory Visit: Payer: Self-pay

## 2024-04-27 ENCOUNTER — Encounter: Payer: Self-pay | Admitting: Family Medicine

## 2024-04-27 DIAGNOSIS — Z780 Asymptomatic menopausal state: Secondary | ICD-10-CM

## 2024-04-27 MED ORDER — ESTRADIOL 0.025 MG/24HR TD PTWK: 0.0250 mg | MEDICATED_PATCH | TRANSDERMAL | 1 refills | Status: DC

## 2024-04-28 ENCOUNTER — Other Ambulatory Visit: Payer: Self-pay | Admitting: Family Medicine

## 2024-04-28 MED ORDER — ESTRADIOL 0.0375 MG/24HR TD PTWK: 0.0375 mg | MEDICATED_PATCH | TRANSDERMAL | 12 refills | Status: AC
